# Patient Record
Sex: Female | Born: 1970 | Hispanic: Yes | Marital: Single | Smoking: Never smoker
Health system: Southern US, Community
[De-identification: ages and names within clinical notes are randomized; demographics above are authoritative.]

## PROBLEM LIST (undated history)

## (undated) DIAGNOSIS — Z992 Dependence on renal dialysis: Secondary | ICD-10-CM

## (undated) DIAGNOSIS — G629 Polyneuropathy, unspecified: Secondary | ICD-10-CM

## (undated) DIAGNOSIS — E139 Other specified diabetes mellitus without complications: Secondary | ICD-10-CM

## (undated) DIAGNOSIS — N186 End stage renal disease: Secondary | ICD-10-CM

## (undated) DIAGNOSIS — E785 Hyperlipidemia, unspecified: Secondary | ICD-10-CM

## (undated) DIAGNOSIS — N289 Disorder of kidney and ureter, unspecified: Secondary | ICD-10-CM

## (undated) HISTORY — PX: AV FISTULA PLACEMENT: SHX1204

## (undated) HISTORY — PX: LEG SURGERY: SHX1003

---

## 2009-05-28 ENCOUNTER — Inpatient Hospital Stay: Payer: Self-pay | Admitting: *Deleted

## 2009-08-16 ENCOUNTER — Inpatient Hospital Stay: Payer: Self-pay | Admitting: Internal Medicine

## 2009-08-17 ENCOUNTER — Ambulatory Visit: Payer: Self-pay | Admitting: Cardiovascular Disease

## 2009-09-22 ENCOUNTER — Ambulatory Visit: Payer: Self-pay | Admitting: Vascular Surgery

## 2009-10-04 ENCOUNTER — Ambulatory Visit: Payer: Self-pay | Admitting: Vascular Surgery

## 2009-10-28 ENCOUNTER — Emergency Department: Payer: Self-pay | Admitting: Emergency Medicine

## 2010-08-16 ENCOUNTER — Ambulatory Visit: Payer: Self-pay | Admitting: Ophthalmology

## 2010-08-29 ENCOUNTER — Ambulatory Visit: Payer: Self-pay | Admitting: Ophthalmology

## 2010-11-24 ENCOUNTER — Ambulatory Visit: Payer: Self-pay | Admitting: Ophthalmology

## 2010-12-05 ENCOUNTER — Ambulatory Visit: Payer: Self-pay | Admitting: Ophthalmology

## 2011-09-11 ENCOUNTER — Emergency Department: Payer: Self-pay | Admitting: Emergency Medicine

## 2011-09-11 LAB — COMPREHENSIVE METABOLIC PANEL
Alkaline Phosphatase: 212 U/L — ABNORMAL HIGH (ref 50–136)
BUN: 28 mg/dL — ABNORMAL HIGH (ref 7–18)
Bilirubin,Total: 0.9 mg/dL (ref 0.2–1.0)
Co2: 27 mmol/L (ref 21–32)
Creatinine: 3.5 mg/dL — ABNORMAL HIGH (ref 0.60–1.30)
EGFR (Non-African Amer.): 15 — ABNORMAL LOW
Osmolality: 272 (ref 275–301)
Potassium: 4.1 mmol/L (ref 3.5–5.1)
SGPT (ALT): 28 U/L
Sodium: 130 mmol/L — ABNORMAL LOW (ref 136–145)
Total Protein: 8.8 g/dL — ABNORMAL HIGH (ref 6.4–8.2)

## 2011-09-11 LAB — MAGNESIUM: Magnesium: 1.8 mg/dL

## 2011-09-11 LAB — CBC
HGB: 12.4 g/dL (ref 12.0–16.0)
Platelet: 164 10*3/uL (ref 150–440)
RBC: 4.17 10*6/uL (ref 3.80–5.20)

## 2011-10-24 ENCOUNTER — Ambulatory Visit: Payer: Self-pay | Admitting: Vascular Surgery

## 2012-07-14 ENCOUNTER — Ambulatory Visit: Payer: Self-pay | Admitting: Vascular Surgery

## 2012-07-24 ENCOUNTER — Other Ambulatory Visit: Payer: Self-pay | Admitting: Internal Medicine

## 2012-07-24 LAB — VANCOMYCIN, TROUGH: Vancomycin, Trough: 10 ug/mL (ref 10–20)

## 2012-08-02 ENCOUNTER — Other Ambulatory Visit: Payer: Self-pay | Admitting: Internal Medicine

## 2012-08-02 LAB — VANCOMYCIN, TROUGH: Vancomycin, Trough: 29 ug/mL (ref 10–20)

## 2014-04-09 ENCOUNTER — Inpatient Hospital Stay: Payer: Self-pay | Admitting: Internal Medicine

## 2014-06-12 IMAGING — XA IR VASCULAR PROCEDURE
11 series · 15 of 20 positions shown · IV contrast (IODINE)
Comparison: none

[Series 1: care upper arm · 1 of 2 slices shown (1 of 9)]
[im 1/2]
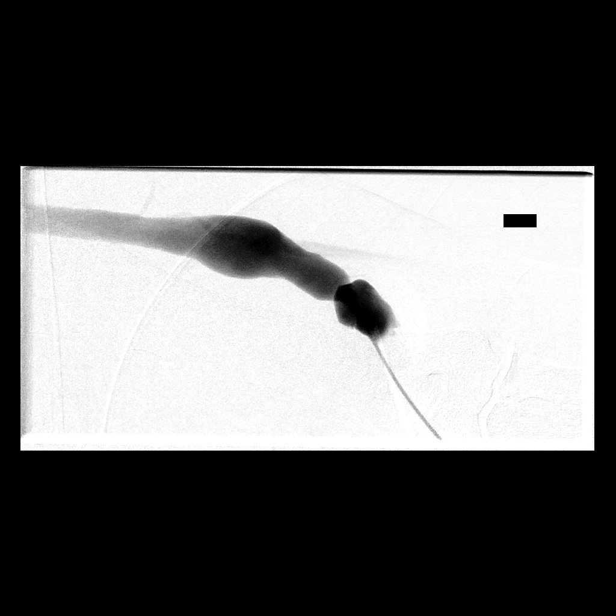

[Series 2: care upper arm · 2 of 2 slices shown (2 of 9)]
[im 1/2]
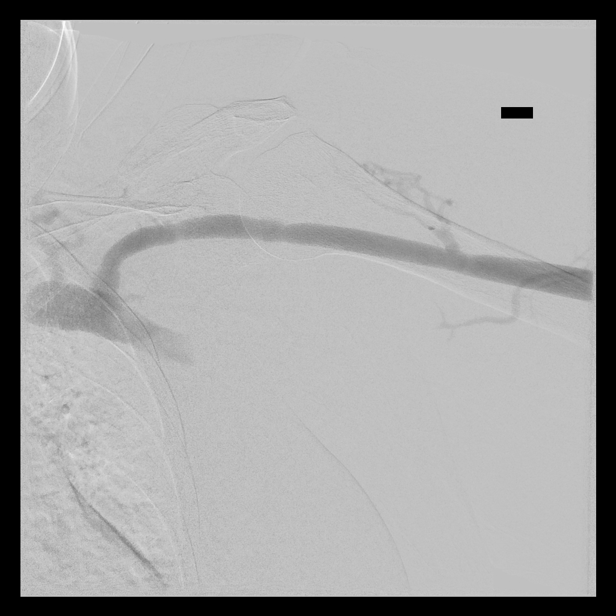
[im 2/2]
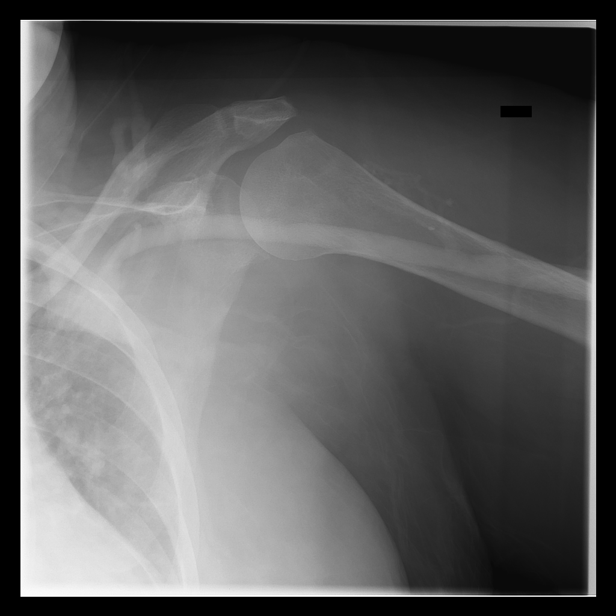

[Series 3: care upper arm · 1 of 2 slices shown (3 of 9)]
[im 1/2]
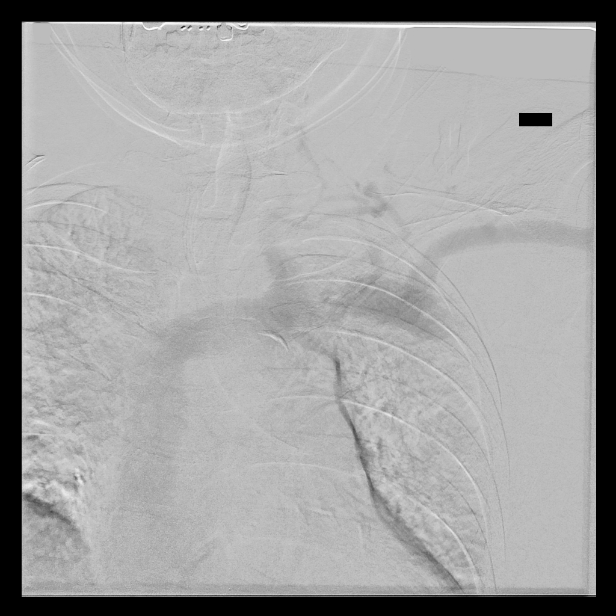

[Series 4: care upper arm · 2 of 2 slices shown (4 of 9)]
[im 1/2]
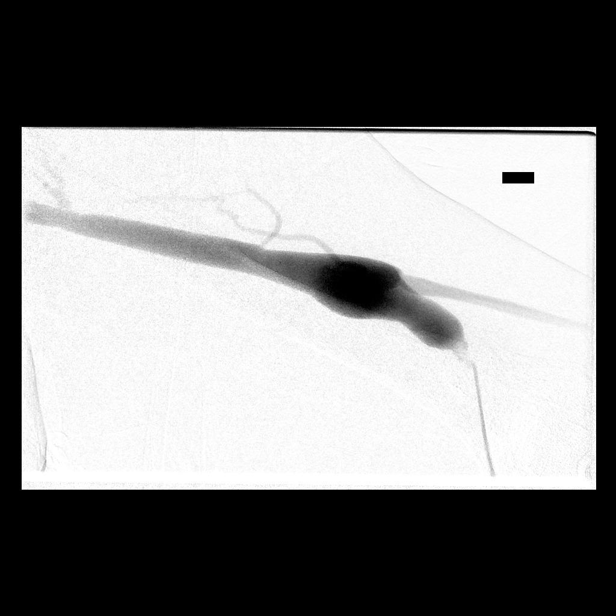
[im 2/2]
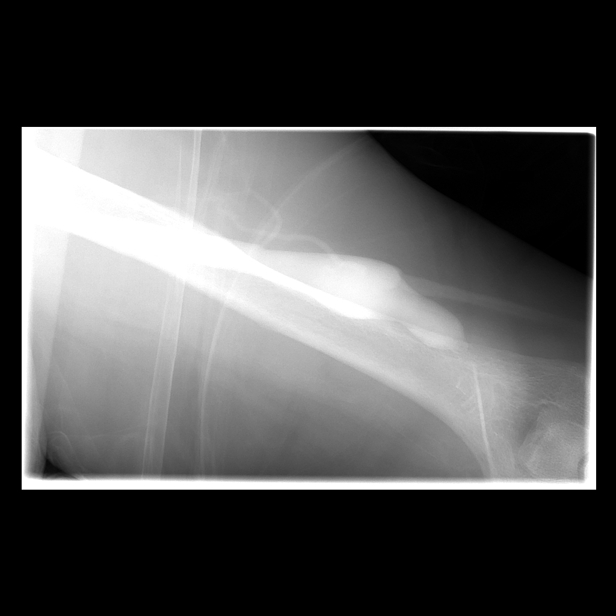

[Series 5: care upper arm · 1 of 2 slices shown (5 of 9)]
[im 1/2]
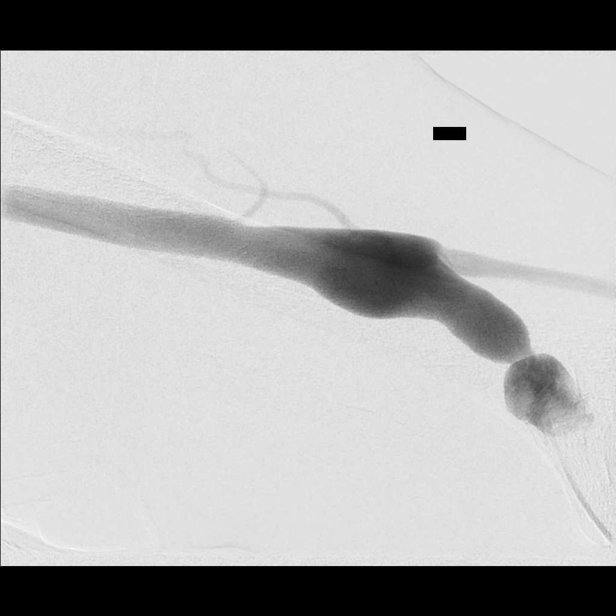

[Series 6: care upper arm · 2 of 2 slices shown (6 of 9)]
[im 1/2]
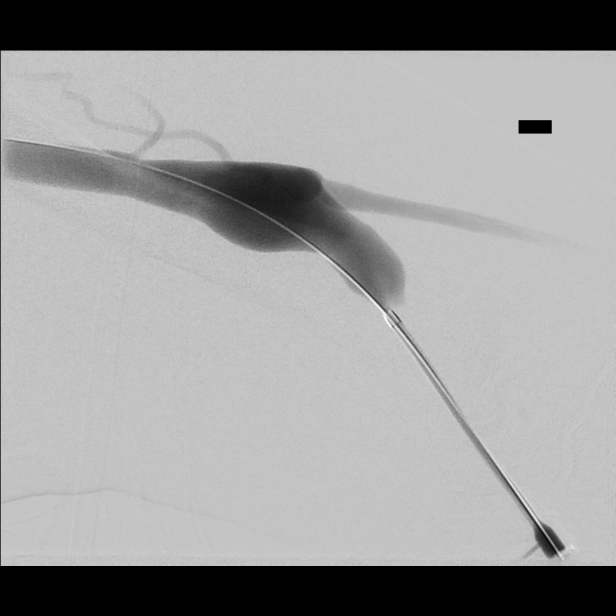
[im 2/2]
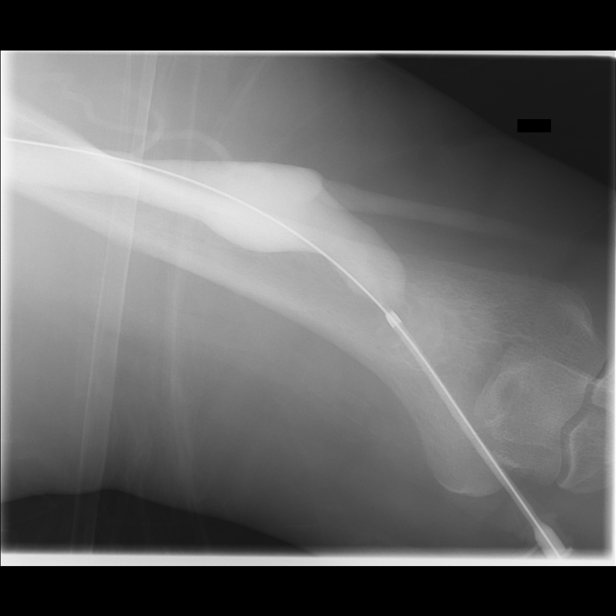

[Series 7: fl - angio · 1 of 1 slices shown (1 of 2)]
[im 1/1]
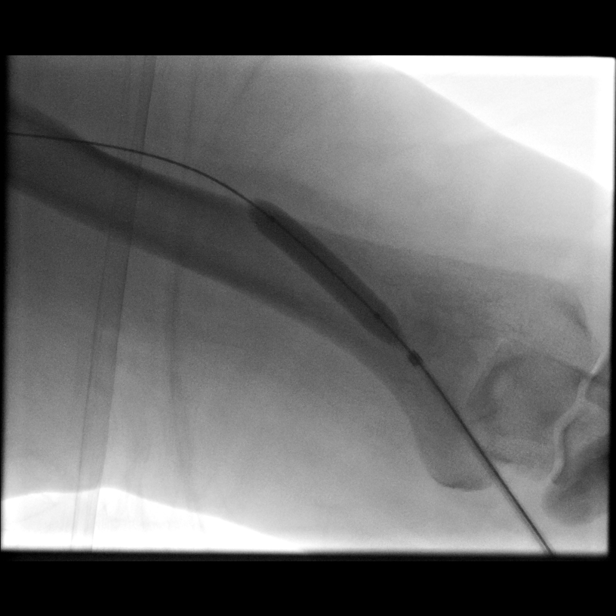

[Series 8: care upper arm · 1 of 2 slices shown (7 of 9)]
[im 2/2]
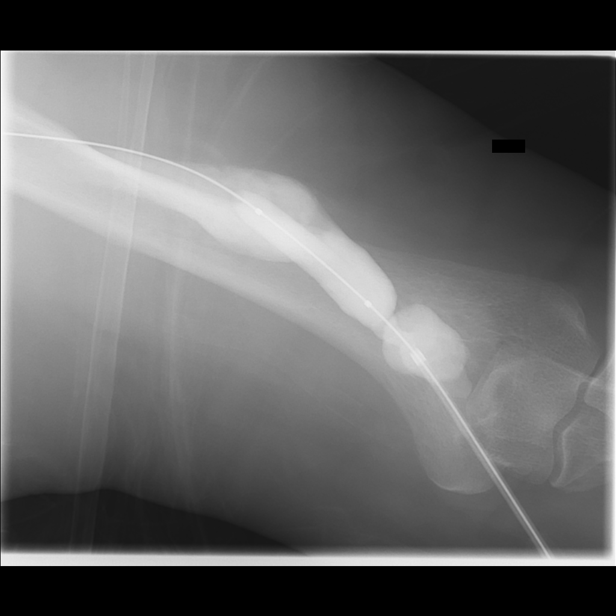

[Series 9: fl - angio · 1 of 1 slices shown (2 of 2)]
[im 1/1]
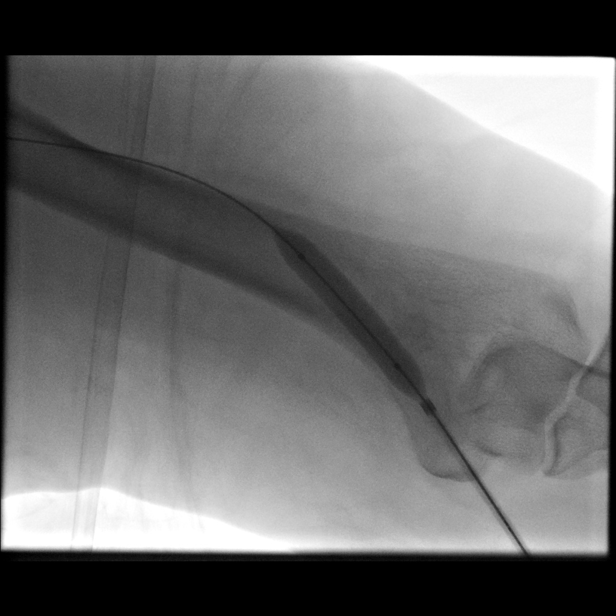

[Series 10: care upper arm · 1 of 2 slices shown (8 of 9)]
[im 1/2]
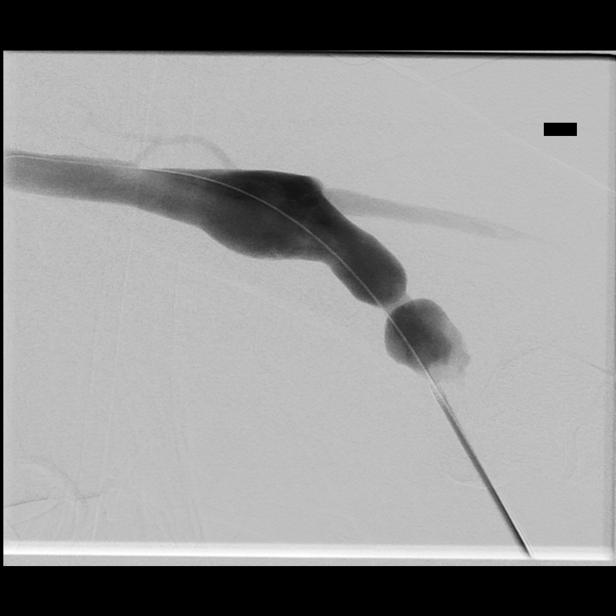

[Series 11: care upper arm · 2 of 2 slices shown (9 of 9)]
[im 1/2]
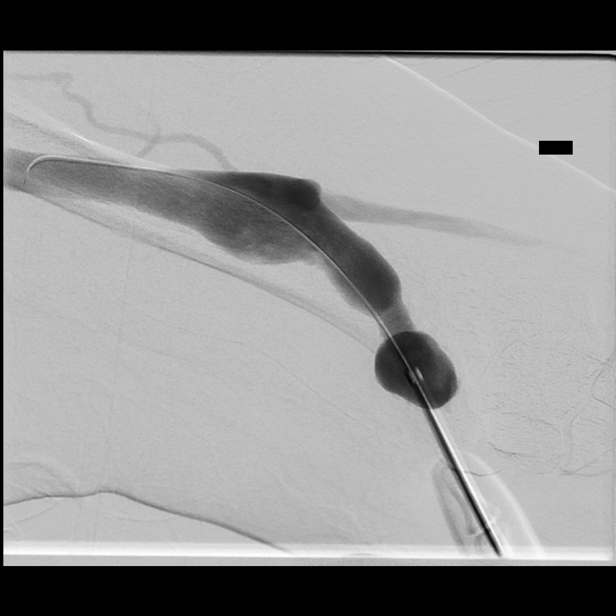
[im 2/2]
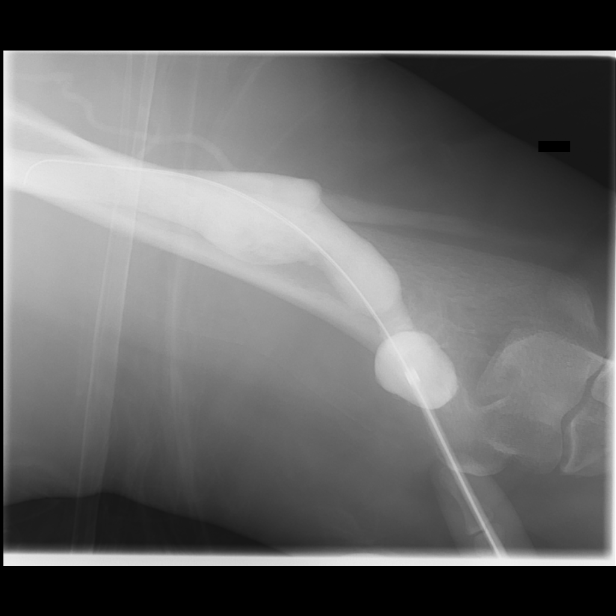

[15 of 20 positions shown; findings below may reference images not displayed]

IMAGES IMPORTED FROM THE [employer] WORKFLOW SYSTEM
NO DICTATION FOR STUDY

## 2014-06-15 NOTE — Op Note (Signed)
PATIENT NAME:  [name], [name] MR#:  [id number] DATE OF BIRTH:  Jun 08, 1970  DATE OF PROCEDURE:  10/24/2011  PREOPERATIVE DIAGNOSES:  1. End-stage renal disease.  2. Poorly functioning left arm AV fistula with recent decrease in transonic flows. 3. Hypertension.   POSTOPERATIVE DIAGNOSES:  1. End-stage renal disease.  2. Poorly functioning left arm AV fistula with recent decrease in transonic flows. 3. Hypertension.   PROCEDURES:  1. Ultrasound guidance for vascular access, left brachiocephalic AV fistula.  2. Left upper extremity fistulogram.  3. Percutaneous transluminal angioplasty with 8 and 10 mm diameter angioplasty balloons for a cephalic vein stenosis in the proximal portion of the fistula between aneurysmal segments.   SURGEON: Annice NeedyJason S. Yides Saidi, MD   ANESTHESIA: Local with moderate conscious sedation.   ESTIMATED BLOOD LOSS: Approximately 50 mL.  FLUOROSCOPY TIME: Approximately 6 minutes.   CONTRAST USED: 40 mL.   INDICATION FOR PROCEDURE: This is a 44 year old Hispanic female with renal failure. She has had a recent decrease in her transonic flows and we are asked to evaluate this with fistulogram. Risks and benefits are discussed. Informed consent was obtained.   DESCRIPTION OF PROCEDURE: The patient is brought to the Vascular Interventional Radiology Suite. Left upper extremity was sterilely prepped and draped and a sterile surgical field was created. The fistula was accessed not far beyond the anastomosis with a micropuncture needle, micropuncture wire and then sheath were placed. The imaging showed a high-grade stenosis in the 80 to 90% range between aneurysmal segments in the proximal portion of the fistula. The remainder of the fistula was patent without significant flow limiting stenosis. The patient was given 2500 units of intravenous heparin for systemic anticoagulation. A 6 French sheath was placed over a Glidewire. Glidewire was used to cross the lesion which was done  with a moderate amount of difficulty. I then performed percutaneous transluminal angioplasty for this stenosis with an 8 mm diameter angioplasty balloon with suboptimal result and then a 10 mm diameter angioplasty balloon was used which improved the flow and decreased the area of stenosis to the 20 to 30% range. At this point I elected to terminate the procedure. The sheath was removed around a 4-0 Monocryl purse-string suture. Pressure was held. Sterile dressing was placed. The patient tolerated the procedure well and was taken to the recovery room in stable condition.   ____________________________ Annice NeedyJason S. Henslee Lottman, MD jsd:drc D: 10/24/2011 15:09:01 ET T: 10/24/2011 15:51:07 ET JOB#: [id number]  cc: Annice NeedyJason S. Martice Doty, MD, <Dictator> Molli Barrowsaven Voora, MD Annice NeedyJASON S Ralynn San MD ELECTRONICALLY SIGNED 10/31/2011 10:01

## 2014-06-18 NOTE — Op Note (Signed)
PATIENT NAME:  [name], [name] MR#:  [id number] DATE OF BIRTH:  Jul 07, 1970  DATE OF PROCEDURE:  07/14/2012  PREOPERATIVE DIAGNOSES: 1.  End-stage renal disease.  2.  Poorly functioning left arm arteriovenous fistula.  3.  Hypertension.  4.  Diabetes.   POSTOPERATIVE DIAGNOSES: 1.  End-stage renal disease.  2.  Poorly functioning left arm arteriovenous fistula.  3.  Hypertension.  4.  Diabetes.   PROCEDURES: 1.  Ultrasound guidance for vascular access to left arm AV fistula.  2.  Left upper extremity fistulogram and central venogram.  3.  Percutaneous transluminal angioplasty of perianastomotic stenosis with 6 mm diameter angioplasty balloon.  4.  Percutaneous transluminal angioplasty of cephalic vein stenosis near pseudoaneurysm with 9 mm diameter angioplasty balloon.   SURGEON: Annice NeedyJason S. Arland Usery, M.D.   ANESTHESIA: Local with moderate conscious sedation.   ESTIMATED BLOOD LOSS: Minimal.   CONTRAST USED: 35 mL Visipaque.   FLUOROSCOPY TIME: Approximately 2 minutes.   INDICATION FOR PROCEDURE: A 44 year old Hispanic female with end-stage renal disease. Noninvasive study showed stenosis in her AV fistula with diminished function on dialysis. She is brought in for further evaluation with fistulogram for salvage of the AV fistula. Risks and benefits were discussed. Informed consent was obtained.   DESCRIPTION OF PROCEDURE: The patient is brought to the vascular and interventional radiology suite. Left upper extremity was sterilely prepped and draped, and a sterile surgical field was created. The fistula was accessed in the mid upper arm initially in retrograde fashion with ultrasound guidance, and a micropuncture needle and micropuncture wire and sheath were then placed. We upsized to a 6-French sheath. A Kumpe catheter was used to gain access at the level of the anastomosis. This showed an intimal hyperplasia stenosis that was in the moderate to high-grade range at just beyond the  anastomosis. About 5 to 7 cm further downstream, there was a pseudoaneurysm at the arterial access site associated with what appeared to be a moderate stenosis. This was a little bit difficult to evaluate due to a moderate-sized pseudoaneurysm, but this also correlated with an area of increased velocities on her ultrasound. The remainder of the fistula was patent. There was some mild narrowing near the cephalic vein-subclavian vein confluence, but this did not appear flow-limiting. Central venous circulation was patent. I crossed these lesions with a Magic Torque wire. I treated the perianastomotic stenosis with a 6 mm diameter angioplasty balloon and the cephalic vein stenosis near the pseudoaneurysm with a 9 mm diameter angioplasty balloon. Tight waists were taken in both locations, which improved with angioplasty. Following angioplasty, angiogram showed this area  now to have  significant improvement. There was probably a 30% residual stenosis in the perianastomotic stenosis and approximately a 20% residual stenosis near the pseudoaneurysm. Neither were flow limiting. At this point, I elected to terminate the procedure. The sheath was removed around a 4-0 Monocryl pursestring suture. Pressure was held. Sterile dressing was placed.  ____________________________ Annice NeedyJason S. Laszlo Ellerby, MD jsd:jm D: 07/14/2012 11:50:39 ET T: 07/14/2012 13:27:06 ET JOB#: [id number]  cc: Annice NeedyJason S. Yandel Zeiner, MD, <Dictator> Annice NeedyJASON S Genora Arp MD ELECTRONICALLY SIGNED 07/23/2012 13:50

## 2014-06-21 LAB — SURGICAL PATHOLOGY

## 2014-06-27 NOTE — H&P (Signed)
PATIENT NAME:  [name], [name] MR#:  [id number] DATE OF BIRTH:  1970/03/29  DATE OF ADMISSION:  04/09/2014  REFERRING PHYSICIAN: forbach  [facility]   CHIEF COMPLAINT: Left foot pain.   HISTORY OF PRESENT ILLNESS: A 44 year old Hispanic female, Spanish-speaking, with history of type 2 diabetes, insulin-requiring, complicated by neuropathy, as well as end-stage renal disease on hemodialysis, presenting with left foot pain. She describes a 2 day duration of left foot pain, described only as "pain." Intensity 7 to 8 out of 10, worse with movement as well as standing. No relieving factors.  associated edema erythema, subjective fevers and chills, thus presented to the hospital for further work-up and evaluation given worsening symptoms. Of note, last hemodialysis performed yesterday, and she is due once again tomorrow.   REVIEW OF SYSTEMS:  CONSTITUTIONAL: Positive for fevers, chills, fatigue, weakness.  EYES: Denies blurred vision, double vision, double vision, or eye pain.  EARS, NOSE, THROAT: Denies tinnitus, ear pain, hearing loss.  RESPIRATORY: Denies cough, wheeze, shortness of breath.  CARDIOVASCULAR: Denies chest pain, palpitations, edema.  GASTROINTESTINAL: Denies nausea, vomiting, diarrhea, or abdominal pain.  GENITOURINARY: Denies dysuria, hematuria.  ENDOCRINE: Denies nocturia or thyroid problems.  HEMATOLOGIC AND LYMPHATIC: Denies easy bruising or bleeding.  SKIN: Positive for erythematous lesion of the left foot.  MUSCULOSKELETAL: Denies pain in neck, back, shoulder, knees, hips or further arthritic symptoms.  NEUROLOGIC: Denies paralysis, paresthesias.  PSYCHIATRIC: Denies anxiety or depressive symptoms.   Otherwise, full review of systems performed by me is negative.   PAST MEDICAL HISTORY: End-stage renal disease on hemodialysis, hyperlipidemia, unspecified; hypertension essential, type 2 diabetes insulin-requiring, complicated by  peripheral neuropathy.   SOCIAL HISTORY: No alcohol, tobacco, or drug usage.   FAMILY HISTORY: No known cardiovascular or pulmonary disorders.   ALLERGIES: NO KNOWN DRUG ALLERGIES.   HOME MEDICATIONS: Include aspirin 81 mg p.o. daily, lisinopril 40 mg 2 tabs p.o. daily, Lantus 10 units subcutaneous at bedtime, NovoLog 5 units b.i.d., sertraline 10 mg p.o. daily, metoprolol 100 mg p.o. b.i.d., Norvasc 10 mg p.o. daily, Sensipar 60 mg p.o. daily, Fosrenol 1000 mg 2 tablets p.o. 3 times daily.   PHYSICAL EXAMINATION:  VITAL SIGNS: Temperature 99.2, heart rate 105, respirations 18,  188/65, saturating 98% on room air. Weight 180 kg, BMI 66.  GENERAL: Obese Hispanic female currently in minimal distress given pain.  HEAD: Normocephalic, atraumatic.  EYES: Pupils equal, round, and reactive to light. Extraocular muscles intact. No scleral icterus.  MOUTH: Moist mucosal membrane. Dentition intact. No abscess noted.  EARS, NOSE AND THROAT: Clear without exudates. No external lesions.  NECK: Supple. No thyromegaly. No nodules. No JVD.  PULMONARY: Clear to auscultation bilaterally without wheezes, rales, rhonchi. No use of accessory muscles. Good respiratory effort.  CHEST: Nontender to palpation.  CARDIOVASCULAR: S1, S2, regular rate and rhythm. No murmurs, rubs, or gallops. No edema. Pedal pulses 2+ bilaterally.  GASTROINTESTINAL: Soft, nontender, nondistended. No masses. Positive bowel sounds. No hepatosplenomegaly.  MUSCULOSKELETAL: Gross swelling and deformation of left foot, large area of soft tissue swelling of the left arch with associated edema around the medial malleolus which is fluctuant. Range of motion full in all extremities.  NEUROLOGIC: Cranial nerves II through XII intact. No gross focal neurological deficits. Sensation intact. Reflexes intact.  SKIN: Erythematous area of left foot, essentially from ankle downward with large soft tissue swelling as stated above, associated erythema,  which is also warm to touch from the ankle downward. Otherwise, no further lesions, rashes,  or cyanosis. Skin warm, dry. Turgor intact.  PSYCHIATRIC: Mood and affect within normal limits. The patient is awake, alert x3. Insight and judgment intact.   LABORATORY DATA: Sodium 137, potassium 4.3, chloride 97, bicarbonate 25, BUN 56, creatinine 6.7, glucose 176. LFTs: Bilirubin 1.3, alkaline phosphatase 136, AST 14, otherwise within normal limits. WBC of 13.9, hemoglobin 11.9, platelets of 126,000. X-ray of the left foot performed reveals significant soft tissue swelling without soft tissue gas or radiopaque foreign body.   ASSESSMENT AND PLAN: A 44 year old Hispanic female with history of type 2 diabetes, insulin-requiring, complicated by neuropathy as end stage renal disease, on hemodialysis, presented with left foot pain.   1. Sepsis: Meeting septic criteria given, heart rate, as well as leukocytosis secondary to skin infection.  Antibiotic coverage  with vancomycin and Zosyn given history of diabetes. Blood cultures and  formal consult podiatry for the findings of left foot as described above.  2. End-stage renal disease on hemodialysis. Consult nephrology for continuation of dialysis.  3. Hypertension, essential. Continue with her home medications. Also add p.r.n. hydralazine for blood pressure greater than180/100 4. Type 2 diabetes, insulin-requiring, complicated by neuropathy   Accu-Cheks, insulin sliding scale, as well as continuation of home dosage of Lantus.  5. Venous thromboembolism prophylaxis with sequential compression devices.  6. The patient is full code.   TIME SPENT: 45 minutes.   ____________________________ Cletis Athens. Hower, MD dkh:ap D: 04/09/2014 21:40:05 ET T: 04/09/2014 21:59:27 ET JOB#: [id number]  cc: Cletis Athens. Hower, MD, <Dictator> DAVID Synetta Shadow MD ELECTRONICALLY SIGNED 04/10/2014 2:14

## 2014-06-27 NOTE — Op Note (Signed)
PATIENT NAME:  [name], [name] MR#:  [id number] DATE OF BIRTH:  [birth year]  DATE OF PROCEDURE:  04/12/2014   PREOPERATIVE DIAGNOSIS: Soft tissue mass, left foot with abscess.   POSTOPERATIVE DIAGNOSIS:  Multi-lobulated large soft tissue mass, plantar left foot.   PROCEDURE: Excision of soft tissue mass, plantar left foot.   SURGEON: Livio Ledwith A. Ether Curtis, DPM.   ANESTHESIA: LMA with local.   HEMOSTASIS: Mid-calf tourniquet inflated to 300 mmHg for 30 minutes.   COMPLICATIONS: None.   SPECIMEN: Large soft tissue mass, multi-lobulated with gross appearance of sebaceous cyst type of lesion, left foot and culture was taken of the lesion.   ESTIMATED BLOOD LOSS: 200 mL.   COMPLICATIONS: None.   OPERATIVE INDICATIONS: This is a 44 year old female seen upon admission with an elevated white blood cell count with erythema and a draining superficial ulcerative site to the plantar aspect of her left arch.  MRI was performed, which showed a multi-loculated large soft tissue mass with possible abscess.  In the hospital, she was found to have a thick purulent-type of drainage from the plantar aspect of her left foot consistent with an abscess.  She also had a thick caseous-type of fluid, and material that was sent for pathological examination. This did reveal findings similar to the sebaceous or epidermal cystic lesion. With the findings of what looked to be a large fluid-filled mass with possible infection, we discussed surgical I and D of this with possible excision of the mass and she consented to this.   DESCRIPTION OF PROCEDURE: The patient was brought into the OR and placed on operating table in the supine position. General intubation was administered by the anesthesia team. The left lower extremity was then prepped and draped in the usual sterile fashion. Attention was directed to the plantar aspect of the left foot, where after inflation of the tourniquet, a large curvilinear incision was made  proximal to the MTPJ to the heel region overlying the central portion of the mass.  There was noted to be a large amount of venous bleeding where eventually, I had to drop the tourniquet and the bleeding ceased.  I did pack the area with Surgicel, both during the procedure as well as at closure.  After all bleeding was stopped, I was able to further dissect into the lesion. There was noted a multi-lobulated mass extending from the skin to the fascial layers with area surrounding the fascia. I excised a large bulk of the lesion. There were small areas of thick, semi-purulent type of material consistent with sebaceous cyst type of appearance.  No further areas of purulent drainage were noted after excision of the bulk of the soft tissue mass. The wound was flushed with copious amounts of irrigation.  At this time, a layered closure was performed with a 3-0 Vicryl for the deeper layer and a 2-0 nylon for skin.  A well compressed sterile bulky dressing was then placed around the left foot and ankle.  She was taken from the Operating Room to the PACU with all vital signs stable and neurovascular status intact.  She will be readmitted to the floor.  We will monitor this skin flaps for any dysvascular appearance.  Hopefully, I can discharge in the next couple of days, if the patient is stable. We will await final pathology results as well.     ____________________________ Argentina Donovan. Ether Curtis, DPM jaf:DT D: 04/12/2014 17:04:10 ET T: 04/12/2014 19:19:19 ET JOB#: [id number]  cc: [name], DPM, <Dictator> [name]  [name] DPM ELECTRONICALLY SIGNED 04/16/2014 9:48

## 2014-06-27 NOTE — Consult Note (Signed)
Admit Diagnosis:   SEPSIS DUE TO UNSPECIFIED ORGANISM.: Onset Date: 10-Apr-2014, Status: Active, Description: SEPSIS DUE TO UNSPECIFIED ORGANISM.    hemodialysis:    High cholesterol:    HTN:    ESRD:    DM:    fistula left FA:    right chest dialysis port:   Home Medications: Medication Instructions Status  Sensipar 60 mg oral tablet 1 tab(s) orally once a day Active  lisinopril 40 mg oral tablet 2 tab(s) orally once a day Active  metoprolol tartrate 100 mg oral tablet 1 tab(s) orally 2 times a day Active  cetirizine 10 mg oral tablet 1 tab(s) orally once a day Active  amLODIPine 10 mg oral tablet 1 tab(s) orally once a day Active  Fosrenol 1000 mg oral tablet, chewable 2 tab(s) orally 3 times a day (with meals) Active  aspirin 81 mg oral tablet 1 tab(s) orally once a day Active  Lantus 100 units/mL subcutaneous solution 10 unit(s) subcutaneous once a day (at bedtime) Active  NovoLog 100 units/mL subcutaneous solution 5 unit(s) subcutaneous 2 times a day (with meals) Active   Lab Results:  Routine Micro:  12-Feb-16 19:52   Micro Text Report BLOOD CULTURE   COMMENT                   NO GROWTH IN 8-12 HOURS   ANTIBIOTIC                         19:53   Micro Text Report BLOOD CULTURE   COMMENT                   NO GROWTH IN 8-12 HOURS   ANTIBIOTIC                       Routine Hem:  12-Feb-16 19:53   Erythrocyte Sed Rate  35 (Result(s) reported on 09 Apr 2014 at 10:26PM.)  13-Feb-16 13:25   WBC (CBC)  16.1  Hemoglobin (CBC)  10.8  Hematocrit (CBC)  33.3   Radiology Results:  Radiology Results: XRay:    12-Feb-16 19:57, Foot Left Complete  Foot Left Complete  REASON FOR EXAM:    pain/wound, r/o osteomyelitis  COMMENTS:       PROCEDURE: DXR - DXR FOOT LT COMP W/OBLIQUES  - Apr 09 2014  7:57PM     CLINICAL DATA:  Pt reports redness to left foot, fever and anterior  chest pressure starting yesterday. Pt has a chronic, non-healing  wound to L foot. Pt  was seen last week at [facility] for increasing pain.  Pt was not rx'd abx at this time. h/o htn, dm, dialysis    EXAM:  LEFT FOOT - COMPLETE 3+ VIEW    COMPARISON:  None.  FINDINGS:  There is significant soft tissue edema of the foot. There is and  lateral subluxation of the fourth and fifth tarsometatarsal joint.  There is complete dislocation of the second and third  tarsometatarsal joints. There is medial subluxation of the first  tarsometatarsal joint. Changes are consistent with divergent  Lisfranc type dislocation. There is fragmentation of the first  cuneiform. No acute fractures or significant osseous erosions.     IMPRESSION:  1. Significant soft tissue swelling without soft tissue gas or  radiopaque foreign body.  2. Divergent Lisfranc dislocation of the midfoot.    Electronically Signed    By: Norva Pavlov M.D.    On: 04/09/2014  20:58         Verified By: Patterson HammersmithELIZABETH D. BROWN, M.D.,    No Known Allergies:   Nursing Flowsheets: **Vital Signs.:   13-Feb-16 07:50  Temperature Temperature (F) 99.3  Oxygen Delivery Room Air/ 21 %    General Aspect Pt seen with interpreter.  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  State ~3days ago developed worsening focal swelling to plantar left midfoot with redness and pain.  Some chills associated and admitted from ED.   Case History and Physical Exam:  Cardiovascular Palpable pulses to left foot.   Musculoskeletal Noted focal edema to left foot with rocker bottom shape.  Marked focal prominence to midfoot with mild fluctuence.  No severe crepitence with midfoot ROM.  Some pain with palpation to midfoot.   Neurological Gross sensation intact with palpation.  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   Skin Erythema to diffuse plantar midfoot.  Small focal hyperkeratotic lesion of midfoot.  Supicious for abscess    Impression Pt with marked midfoot deformity 2ndary to charcot foot. Concern for  abscess vs worsening acute deformity. Will attempt beside debridment and culture if infection seen.   Likely MRI to further assess midfoot for infection vs acute charcot.   Electronic Signatures for Addendum Section:  Gwyneth RevelsFowler, Arria Naim (MD) (Signed Addendum 13-Feb-16 16:05)  Bedside Debridment Excisional type performed. Prepped with Betadine and superficial debridemnt performed to left foot.  Depth to dermis.  At this time a thick caseous material was expressed.  No frank purulence.  This was sent for wound culture and pathology. Debridment was 1 cm in diameter.  Large bulky dressing applied.   Electronic Signatures: Gwyneth RevelsFowler, Ercel Normoyle (MD)  (Signed 13-Feb-16 15:30)  Authored: Health Issues, Significant Events - History, Home Medications, Labs, Radiology Results, Allergies, Vital Signs, General Aspect/Present Illness, History and Physical Exam, Impression/Plan   Last Updated: 13-Feb-16 16:05 by Gwyneth RevelsFowler, Millee Denise (MD)

## 2014-06-27 NOTE — Discharge Summary (Signed)
PATIENT NAME:  [name], [name] MR#:  [id number] DATE OF BIRTH:  1970/06/05  DATE OF ADMISSION:  04/09/2014 DATE OF DISCHARGE:  04/15/2014  ADMITTING DIAGNOSIS: Left foot pain.  DISCHARGE DIAGNOSES: 1.  Left foot pain due to large complex epidermoid cyst status post resection. Possible concurrent infection with this. 2.  End-stage renal disease, on dialysis.  3.  Hypertension, essential.  4.  Hyperlipidemia, unspecified. 5.  Peripheral neuropathy.   CONSULTANTS: Dr. Vickki Muff, Dr. Anthonette Legato.  PERTINENT LABS AND EVALUATIONS: Admitting glucose 176, BUN 56, creatinine 6.70, sodium 137, potassium 4.3, chloride 97, CO2 25, calcium 9.2. LFTs showed alk phos of 136. WBC 13.9, hemoglobin 11.9, platelet count 129,000.   Blood cultures: No growth. Wound cultures: No growth.   MRI of the foot showed a complex subcutaneous mass in the plantar mid foot not typical for abscess.   HOSPITAL COURSE: Please refer to H and P done by the admitting physician. The patient is a 44 year old Spanish female who is on dialysis. Presented with swelling on the bottom of her foot. It appeared to be a possible abscess. She did have some fevers on presentation. She was initially thought to have an abscess with sepsis. She was started on broad-spectrum antibiotics. A podiatry consult was obtained. She was seen by Dr. Vickki Muff. The patient went to the OR on February 15th with excision of the soft tissue mass, plantar left foot. Pathology of this returned as an epidermoid cyst. There was no growth. The patient was recommended to bear no weight until the wound healed. At this time, she is stable for discharge with outpatient follow-up with Dr. Vickki Muff.   DISCHARGE MEDICATIONS: Sensipar 60 daily, lisinopril 40 two tabs daily, metoprolol tartrate 100 one tab p.o. b.i.d., cetirizine 10 daily, amlodipine 10 daily, Fosrenol 2000 mg t.i.d. with meals, aspirin 81 one tab p.o. daily, Lantus 10 units at bedtime, NovoLog 5 units b.i.d.  with meals, acetaminophen/oxycodone 325/5 q. 6 p.r.n. for pain, amoxicillin/clavulanic acid 500 one tab p.o. q. 24 x5 days.   DISCHARGE DIET: Low sodium, low fat, low cholesterol, carbohydrate, renal diet.   DISCHARGE ACTIVITY: Left leg: No weight bearing. Use walker to ambulate.  DISCHARGE INSTRUCTIONS: Follow up with Dr. Vickki Muff in 1 week.  TIME SPENT: 35 minutes.  ____________________________ Lafonda Mosses Posey Pronto, MD shp:sb D: 04/16/2014 08:17:25 ET T: 04/16/2014 10:41:09 ET JOB#: [id number]  cc: Malahki Gasaway H. Posey Pronto, MD, <Dictator> Alric Seton MD ELECTRONICALLY SIGNED 04/16/2014 15:58

## 2014-06-27 NOTE — Op Note (Signed)
PATIENT NAME:  [name], [name] MR#:  [id number] DATE OF BIRTH:  [birth year]  DATE OF PROCEDURE:  04/14/2014  REASON FOR PROCEDURE:  Wound dehiscence, left foot.   INDICATIONS:  I was informed that the patient had been bearing weight on her left foot with drainage and dehiscence of the incision on the plantar aspect of her left foot. She was seen today and had a noted area of approximately 4 cm in length x 1 cm in width dehisced area in the central aspect of her incision from the previous surgery. We discussed primary closure of this at bedside. The patient is completely neuropathic, and no anesthesia was needed. The patient did consent to this verbally. The patient was seen with the assistance of an interpreter.   PROCEDURE NOTE:  Timeout was performed at bedside, and the proper area was exposed and marked and draped appropriately. The wound was then prepped with Betadine. Next, a 3-0 nylon was used for closure of the central portion of the dehisced area. An approximately 4 cm wound dehiscence had occurred. Good closure was performed. There was no purulent drainage or signs of infection to the area. A bulky sterile dressing was placed on her left foot. Strict nonweightbearing was once again discussed with the patient with the assistance of the interpreter. I will see her in the outpatient clinic in 5 to 7 days.    ____________________________ Argentina DonovanJustin A. Ether GriffinsFowler, DPM jaf:nb D: 04/14/2014 20:17:15 ET T: 04/15/2014 06:34:07 ET JOB#: [id number]  cc: Jill AlexandersJustin A. Ether GriffinsFowler, DPM, <Dictator> Waverly Tarquinio DPM ELECTRONICALLY SIGNED 04/16/2014 9:48

## 2015-07-11 DIAGNOSIS — Z833 Family history of diabetes mellitus: Secondary | ICD-10-CM

## 2015-07-11 DIAGNOSIS — Z992 Dependence on renal dialysis: Secondary | ICD-10-CM

## 2015-07-11 DIAGNOSIS — E871 Hypo-osmolality and hyponatremia: Secondary | ICD-10-CM | POA: Diagnosis present

## 2015-07-11 DIAGNOSIS — E1122 Type 2 diabetes mellitus with diabetic chronic kidney disease: Secondary | ICD-10-CM | POA: Diagnosis present

## 2015-07-11 DIAGNOSIS — K5900 Constipation, unspecified: Secondary | ICD-10-CM | POA: Diagnosis present

## 2015-07-11 DIAGNOSIS — R197 Diarrhea, unspecified: Secondary | ICD-10-CM | POA: Diagnosis present

## 2015-07-11 DIAGNOSIS — E785 Hyperlipidemia, unspecified: Secondary | ICD-10-CM | POA: Diagnosis present

## 2015-07-11 DIAGNOSIS — A048 Other specified bacterial intestinal infections: Secondary | ICD-10-CM | POA: Diagnosis present

## 2015-07-11 DIAGNOSIS — I447 Left bundle-branch block, unspecified: Secondary | ICD-10-CM | POA: Diagnosis present

## 2015-07-11 DIAGNOSIS — E211 Secondary hyperparathyroidism, not elsewhere classified: Secondary | ICD-10-CM | POA: Diagnosis present

## 2015-07-11 DIAGNOSIS — Z22322 Carrier or suspected carrier of Methicillin resistant Staphylococcus aureus: Secondary | ICD-10-CM

## 2015-07-11 DIAGNOSIS — E875 Hyperkalemia: Principal | ICD-10-CM | POA: Diagnosis present

## 2015-07-11 DIAGNOSIS — N186 End stage renal disease: Secondary | ICD-10-CM | POA: Diagnosis present

## 2015-07-11 DIAGNOSIS — K6289 Other specified diseases of anus and rectum: Secondary | ICD-10-CM | POA: Diagnosis present

## 2015-07-11 DIAGNOSIS — E1140 Type 2 diabetes mellitus with diabetic neuropathy, unspecified: Secondary | ICD-10-CM | POA: Diagnosis present

## 2015-07-11 LAB — CBC
HEMATOCRIT: 41.6 % (ref 35.0–47.0)
HEMOGLOBIN: 13.8 g/dL (ref 12.0–16.0)
MCH: 29.2 pg (ref 26.0–34.0)
MCHC: 33.1 g/dL (ref 32.0–36.0)
MCV: 88.3 fL (ref 80.0–100.0)
Platelets: 124 10*3/uL — ABNORMAL LOW (ref 150–440)
RBC: 4.71 MIL/uL (ref 3.80–5.20)
RDW: 15.1 % — AB (ref 11.5–14.5)
WBC: 8.6 10*3/uL (ref 3.6–11.0)

## 2015-07-11 NOTE — ED Notes (Addendum)
Pt in with co diarrhea for 2 weeks now co rectal pain states may have a hemorrhoid, no abd pain.  Pt states today had numbness in feet bilat, states has happened in the past.  Pt is on dialysis had treatment Saturday.

## 2015-07-12 ENCOUNTER — Inpatient Hospital Stay
Admission: EM | Admit: 2015-07-12 | Discharge: 2015-07-13 | DRG: 640 | Disposition: A | Discharge status: Home / Self Care | Payer: Self-pay | Attending: Internal Medicine | Admitting: Internal Medicine

## 2015-07-12 ENCOUNTER — Encounter: Payer: Self-pay | Admitting: Internal Medicine

## 2015-07-12 DIAGNOSIS — R197 Diarrhea, unspecified: Secondary | ICD-10-CM

## 2015-07-12 DIAGNOSIS — E875 Hyperkalemia: Secondary | ICD-10-CM

## 2015-07-12 HISTORY — DX: End stage renal disease: N18.6

## 2015-07-12 HISTORY — DX: Dependence on renal dialysis: Z99.2

## 2015-07-12 HISTORY — DX: Polyneuropathy, unspecified: G62.9

## 2015-07-12 HISTORY — DX: Hyperlipidemia, unspecified: E78.5

## 2015-07-12 HISTORY — DX: Other specified diabetes mellitus without complications: E13.9

## 2015-07-12 LAB — COMPREHENSIVE METABOLIC PANEL
ALBUMIN: 4.5 g/dL (ref 3.5–5.0)
ALT: 15 U/L (ref 14–54)
ALT: 13 U/L — ABNORMAL LOW (ref 14–54)
ANION GAP: 20 — AB (ref 5–15)
AST: 14 U/L — ABNORMAL LOW (ref 15–41)
AST: 20 U/L (ref 15–41)
Albumin: 3.9 g/dL (ref 3.5–5.0)
Alkaline Phosphatase: 149 U/L — ABNORMAL HIGH (ref 38–126)
Alkaline Phosphatase: 113 U/L (ref 38–126)
Anion gap: 15 (ref 5–15)
BILIRUBIN TOTAL: 0.9 mg/dL (ref 0.3–1.2)
BUN: 92 mg/dL — ABNORMAL HIGH (ref 6–20)
BUN: 33 mg/dL — ABNORMAL HIGH (ref 6–20)
CHLORIDE: 88 mmol/L — AB (ref 101–111)
CHLORIDE: 91 mmol/L — AB (ref 101–111)
CO2: 25 mmol/L (ref 22–32)
CO2: 27 mmol/L (ref 22–32)
Calcium: 10.3 mg/dL (ref 8.9–10.3)
Calcium: 9.2 mg/dL (ref 8.9–10.3)
Creatinine, Ser: 9 mg/dL — ABNORMAL HIGH (ref 0.44–1.00)
Creatinine, Ser: 4.24 mg/dL — ABNORMAL HIGH (ref 0.44–1.00)
GFR calc Af Amer: 5 mL/min — ABNORMAL LOW (ref >60)
GFR calc non Af Amer: 5 mL/min — ABNORMAL LOW (ref >60)
GFR, EST AFRICAN AMERICAN: 13 mL/min — AB (ref >60)
GFR, EST NON AFRICAN AMERICAN: 12 mL/min — AB (ref >60)
GLUCOSE: 269 mg/dL — AB (ref 65–99)
Glucose, Bld: 145 mg/dL — ABNORMAL HIGH (ref 65–99)
POTASSIUM: 7.1 mmol/L — AB (ref 3.5–5.1)
POTASSIUM: 3.9 mmol/L (ref 3.5–5.1)
SODIUM: 133 mmol/L — AB (ref 135–145)
SODIUM: 133 mmol/L — AB (ref 135–145)
TOTAL PROTEIN: 8.1 g/dL (ref 6.5–8.1)
Total Bilirubin: 1.3 mg/dL — ABNORMAL HIGH (ref 0.3–1.2)
Total Protein: 7 g/dL (ref 6.5–8.1)

## 2015-07-12 LAB — POTASSIUM: POTASSIUM: 4.4 mmol/L (ref 3.5–5.1)

## 2015-07-12 LAB — GASTROINTESTINAL PANEL BY PCR, STOOL (REPLACES STOOL CULTURE)
Adenovirus F40/41: NOT DETECTED
Astrovirus: NOT DETECTED
CAMPYLOBACTER SPECIES: NOT DETECTED
CRYPTOSPORIDIUM: NOT DETECTED
Cyclospora cayetanensis: NOT DETECTED
E. COLI O157: NOT DETECTED
ENTAMOEBA HISTOLYTICA: NOT DETECTED
Enteroaggregative E coli (EAEC): NOT DETECTED
Enteropathogenic E coli (EPEC): DETECTED — AB
Enterotoxigenic E coli (ETEC): NOT DETECTED
Giardia lamblia: NOT DETECTED
NOROVIRUS GI/GII: NOT DETECTED
PLESIMONAS SHIGELLOIDES: NOT DETECTED
Rotavirus A: NOT DETECTED
SALMONELLA SPECIES: NOT DETECTED
SAPOVIRUS (I, II, IV, AND V): NOT DETECTED
SHIGELLA/ENTEROINVASIVE E COLI (EIEC): NOT DETECTED
Shiga like toxin producing E coli (STEC): NOT DETECTED
Vibrio cholerae: NOT DETECTED
Vibrio species: NOT DETECTED
Yersinia enterocolitica: NOT DETECTED

## 2015-07-12 LAB — BASIC METABOLIC PANEL
ANION GAP: 19 — AB (ref 5–15)
ANION GAP: 18 — AB (ref 5–15)
BUN: 100 mg/dL — ABNORMAL HIGH (ref 6–20)
BUN: 104 mg/dL — ABNORMAL HIGH (ref 6–20)
CHLORIDE: 90 mmol/L — AB (ref 101–111)
CHLORIDE: 94 mmol/L — AB (ref 101–111)
CO2: 24 mmol/L (ref 22–32)
CO2: 21 mmol/L — ABNORMAL LOW (ref 22–32)
Calcium: 9.8 mg/dL (ref 8.9–10.3)
Calcium: 9.9 mg/dL (ref 8.9–10.3)
Creatinine, Ser: 8.97 mg/dL — ABNORMAL HIGH (ref 0.44–1.00)
Creatinine, Ser: 8.77 mg/dL — ABNORMAL HIGH (ref 0.44–1.00)
GFR calc Af Amer: 5 mL/min — ABNORMAL LOW (ref >60)
GFR calc Af Amer: 6 mL/min — ABNORMAL LOW (ref >60)
GFR, EST NON AFRICAN AMERICAN: 5 mL/min — AB (ref >60)
GFR, EST NON AFRICAN AMERICAN: 5 mL/min — AB (ref >60)
GLUCOSE: 283 mg/dL — AB (ref 65–99)
Glucose, Bld: 145 mg/dL — ABNORMAL HIGH (ref 65–99)
POTASSIUM: 7.2 mmol/L — AB (ref 3.5–5.1)
POTASSIUM: 6.8 mmol/L — AB (ref 3.5–5.1)
SODIUM: 133 mmol/L — AB (ref 135–145)
Sodium: 133 mmol/L — ABNORMAL LOW (ref 135–145)

## 2015-07-12 LAB — CBC
HEMATOCRIT: 39.1 % (ref 35.0–47.0)
HEMOGLOBIN: 12.8 g/dL (ref 12.0–16.0)
MCH: 29.2 pg (ref 26.0–34.0)
MCHC: 32.7 g/dL (ref 32.0–36.0)
MCV: 89.3 fL (ref 80.0–100.0)
Platelets: 114 10*3/uL — ABNORMAL LOW (ref 150–440)
RBC: 4.37 MIL/uL (ref 3.80–5.20)
RDW: 14.7 % — ABNORMAL HIGH (ref 11.5–14.5)
WBC: 7.2 10*3/uL (ref 3.6–11.0)

## 2015-07-12 LAB — GLUCOSE, CAPILLARY
GLUCOSE-CAPILLARY: 239 mg/dL — AB (ref 65–99)
GLUCOSE-CAPILLARY: 122 mg/dL — AB (ref 65–99)
GLUCOSE-CAPILLARY: 125 mg/dL — AB (ref 65–99)
GLUCOSE-CAPILLARY: 134 mg/dL — AB (ref 65–99)
GLUCOSE-CAPILLARY: 134 mg/dL — AB (ref 65–99)
GLUCOSE-CAPILLARY: 146 mg/dL — AB (ref 65–99)
GLUCOSE-CAPILLARY: 187 mg/dL — AB (ref 65–99)
GLUCOSE-CAPILLARY: 214 mg/dL — AB (ref 65–99)

## 2015-07-12 LAB — PHOSPHORUS: Phosphorus: 8 mg/dL — ABNORMAL HIGH (ref 2.5–4.6)

## 2015-07-12 LAB — C DIFFICILE QUICK SCREEN W PCR REFLEX
C DIFFICILE (CDIFF) TOXIN: NEGATIVE
C DIFFICLE (CDIFF) ANTIGEN: NEGATIVE
C Diff interpretation: NEGATIVE

## 2015-07-12 LAB — TROPONIN I
TROPONIN I: 0.08 ng/mL — AB (ref <0.031)
Troponin I: 0.05 ng/mL — ABNORMAL HIGH (ref <0.031)
Troponin I: 0.04 ng/mL — ABNORMAL HIGH (ref <0.031)

## 2015-07-12 LAB — MRSA PCR SCREENING: MRSA by PCR: POSITIVE — AB

## 2015-07-12 MED ORDER — HEPARIN SODIUM (PORCINE) 5000 UNIT/ML IJ SOLN
5000.0000 [IU] | Freq: Three times a day (TID) | INTRAMUSCULAR | Status: DC
  Administered 2015-07-12 (×3): 5000 [IU] via SUBCUTANEOUS
  Filled 2015-07-12 (×3): qty 1

## 2015-07-12 MED ORDER — ASPIRIN EC 81 MG PO TBEC
81.0000 mg | DELAYED_RELEASE_TABLET | Freq: Every day | ORAL | Status: DC
  Administered 2015-07-13: 81 mg via ORAL
  Filled 2015-07-12 (×2): qty 1

## 2015-07-12 MED ORDER — INSULIN ASPART 100 UNIT/ML ~~LOC~~ SOLN
8.0000 [IU] | Freq: Once | SUBCUTANEOUS | Status: AC
  Administered 2015-07-12: 8 [IU] via INTRAVENOUS
  Filled 2015-07-12: qty 8

## 2015-07-12 MED ORDER — MUPIROCIN 2 % EX OINT
1.0000 "application " | TOPICAL_OINTMENT | Freq: Two times a day (BID) | CUTANEOUS | Status: DC
  Administered 2015-07-12 – 2015-07-13 (×3): 1 via NASAL
  Filled 2015-07-12 (×2): qty 22

## 2015-07-12 MED ORDER — ACETAMINOPHEN 325 MG PO TABS: 650.0000 mg | ORAL_TABLET | Freq: Four times a day (QID) | ORAL | Status: DC | PRN

## 2015-07-12 MED ORDER — INSULIN ASPART 100 UNIT/ML ~~LOC~~ SOLN
0.0000 [IU] | Freq: Three times a day (TID) | SUBCUTANEOUS | Status: DC
  Administered 2015-07-13: 2 [IU] via SUBCUTANEOUS
  Filled 2015-07-12 (×3): qty 2

## 2015-07-12 MED ORDER — CHLORHEXIDINE GLUCONATE CLOTH 2 % EX PADS
6.0000 | MEDICATED_PAD | Freq: Every day | CUTANEOUS | Status: DC
  Administered 2015-07-13: 6 via TOPICAL

## 2015-07-12 MED ORDER — SODIUM CHLORIDE 0.9% FLUSH
3.0000 mL | Freq: Two times a day (BID) | INTRAVENOUS | Status: DC
  Administered 2015-07-12 – 2015-07-13 (×2): 3 mL via INTRAVENOUS

## 2015-07-12 MED ORDER — DEXTROSE 50 % IV SOLN
25.0000 g | Freq: Once | INTRAVENOUS | Status: AC
  Administered 2015-07-12: 25 g via INTRAVENOUS
  Filled 2015-07-12: qty 50

## 2015-07-12 MED ORDER — HYDROCORTISONE 2.5 % RE CREA
TOPICAL_CREAM | Freq: Two times a day (BID) | RECTAL | Status: DC
  Administered 2015-07-12 – 2015-07-13 (×3): via RECTAL
  Filled 2015-07-12 (×2): qty 28.35

## 2015-07-12 MED ORDER — SODIUM CHLORIDE 0.9 % IV SOLN
INTRAVENOUS | Status: DC
  Administered 2015-07-12 – 2015-07-13 (×2): via INTRAVENOUS

## 2015-07-12 MED ORDER — SODIUM CHLORIDE 0.9 % IV BOLUS (SEPSIS)
1000.0000 mL | Freq: Once | INTRAVENOUS | Status: AC
  Administered 2015-07-12: 1000 mL via INTRAVENOUS

## 2015-07-12 MED ORDER — WITCH HAZEL-GLYCERIN EX PADS
MEDICATED_PAD | CUTANEOUS | Status: DC | PRN
  Administered 2015-07-12: 21:00:00 via TOPICAL
  Filled 2015-07-12: qty 100

## 2015-07-12 MED ORDER — HEPARIN SODIUM (PORCINE) 1000 UNIT/ML DIALYSIS
1000.0000 [IU] | INTRAMUSCULAR | Status: DC | PRN
  Filled 2015-07-12: qty 1

## 2015-07-12 MED ORDER — LIDOCAINE-PRILOCAINE 2.5-2.5 % EX CREA
TOPICAL_CREAM | Freq: Every day | CUTANEOUS | Status: DC | PRN
  Filled 2015-07-12: qty 5

## 2015-07-12 MED ORDER — METRONIDAZOLE IN NACL 5-0.79 MG/ML-% IV SOLN
500.0000 mg | Freq: Three times a day (TID) | INTRAVENOUS | Status: DC
  Filled 2015-07-12 (×2): qty 100

## 2015-07-12 MED ORDER — SODIUM CHLORIDE 0.9 % IV SOLN
1.0000 g | Freq: Once | INTRAVENOUS | Status: AC
  Administered 2015-07-12: 1 g via INTRAVENOUS
  Filled 2015-07-12: qty 10

## 2015-07-12 MED ORDER — ACETAMINOPHEN 650 MG RE SUPP: 650.0000 mg | Freq: Four times a day (QID) | RECTAL | Status: DC | PRN

## 2015-07-12 MED ORDER — INSULIN ASPART 100 UNIT/ML ~~LOC~~ SOLN
SUBCUTANEOUS | Status: AC
  Administered 2015-07-12: 8 [IU] via INTRAVENOUS
  Filled 2015-07-12: qty 8

## 2015-07-12 MED ORDER — INSULIN ASPART 100 UNIT/ML ~~LOC~~ SOLN
0.0000 [IU] | Freq: Every day | SUBCUTANEOUS | Status: DC
  Administered 2015-07-12: 2 [IU] via SUBCUTANEOUS
  Filled 2015-07-12: qty 2

## 2015-07-12 MED ORDER — ONDANSETRON HCL 4 MG PO TABS: 4.0000 mg | ORAL_TABLET | Freq: Four times a day (QID) | ORAL | Status: DC | PRN

## 2015-07-12 MED ORDER — ONDANSETRON HCL 4 MG/2ML IJ SOLN: 4.0000 mg | Freq: Four times a day (QID) | INTRAMUSCULAR | Status: DC | PRN

## 2015-07-12 MED ORDER — CIPROFLOXACIN IN D5W 400 MG/200ML IV SOLN
400.0000 mg | INTRAVENOUS | Status: DC
  Administered 2015-07-12: 400 mg via INTRAVENOUS
  Filled 2015-07-12 (×2): qty 200

## 2015-07-12 MED ORDER — PRAMOXINE-ZINC OXIDE IN MO 1-12.5 % RE OINT: TOPICAL_OINTMENT | Freq: Three times a day (TID) | RECTAL | Status: DC | PRN

## 2015-07-12 MED ORDER — PRAMOXINE HCL 1 % RE FOAM
Freq: Three times a day (TID) | RECTAL | Status: DC | PRN
  Filled 2015-07-12: qty 15

## 2015-07-12 NOTE — Progress Notes (Signed)
Report given to [place], rn resuming care of pt.  Pt is alert and oriented, requires spanish interpreter.  NSR, lungs clear on room air.  VSS, afebrile.  Pt received hemodialysis, 1L removed.  PT to be transferred to 2A when bed available.

## 2015-07-12 NOTE — Progress Notes (Signed)
Pre-hd tx 

## 2015-07-12 NOTE — H&P (Signed)
[facility] [place] [facility]   PATIENT NAME: Stephanie LatinoSonia Reyes Curtis    MR#:  [id number]  DATE OF BIRTH:  1970/09/02  DATE OF ADMISSION:  07/12/2015  PRIMARY CARE PHYSICIAN: No PCP Per Patient   REQUESTING/REFERRING PHYSICIAN:   CHIEF COMPLAINT:   Chief Complaint  Patient presents with  . Diarrhea    HISTORY OF PRESENT ILLNESS: Stephanie LatinoSonia Reyes Curtis  is a 45 y.o. female with a known history of End-stage renal is on dialysis, diabetes mellitus, hyperlipidemia, neuropathy presented to the emergency room with diarrhea and rectal pain. The diarrhea has been going on for the last 2 weeks and was watery diarrhea. No history of any use of antibiotics recently. No history of recent travel or sick contacts at home. Patient also has some rectal pain which is secondary to hemorrhoids. No history of any rectal bleed. No history of nausea or vomiting hematemesis or hemoptysis. Patient was evaluated in the emergency room she was found to have elevated potassium of more than 7. Patient was given IV calcium gluconate, IV dextrose and  IV insulin in the emergency room for hyperkalemia. EKG showed left bundle branch block and we do not have any old EKG to compare. Patient is kept on enteric precautions and stool for C. difficile toxin was ordered. No history of any abdominal pain. No complaints of chest pain. No history of any shortness of breath. No history of headache dizziness or blurry vision. No history of syncope or seizure.  PAST MEDICAL HISTORY:   Past Medical History  Diagnosis Date  . ESRD on dialysis (HCC)   . Diabetes 1.5, managed as type 2 (HCC)   . Hyperlipidemia   . Neuropathy (HCC)     PAST SURGICAL HISTORY: Past Surgical History  Procedure Laterality Date  . Leg surgery    . Av fistula placement      SOCIAL HISTORY:  Social History  Substance Use Topics  . Smoking status: Never Smoker   . Smokeless tobacco: Not on file  . Alcohol Use: No    FAMILY  HISTORY:  Family History  Problem Relation Age of Onset  . Diabetes Mellitus II Father     DRUG ALLERGIES: No Known Allergies  REVIEW OF SYSTEMS:   CONSTITUTIONAL: No fever, has weakness.  EYES: No blurred or double vision.  EARS, NOSE, AND THROAT: No tinnitus or ear pain.  RESPIRATORY: No cough, shortness of breath, wheezing or hemoptysis.  CARDIOVASCULAR: No chest pain, orthopnea, edema.  GASTROINTESTINAL: No nausea, vomiting, has diarrhea, no abdominal pain.  GENITOURINARY: No dysuria, hematuria. Has rectal pain ENDOCRINE: No polyuria, nocturia,  HEMATOLOGY: No anemia, easy bruising or bleeding SKIN: No rash or lesion. MUSCULOSKELETAL: No joint pain or arthritis.   NEUROLOGIC: No tingling, numbness, weakness.  PSYCHIATRY: No anxiety or depression.   MEDICATIONS AT HOME:  Prior to Admission medications   Not on File      PHYSICAL EXAMINATION:   VITAL SIGNS: Blood pressure 161/66, pulse 65, temperature 97.5 F (36.4 C), temperature source Oral, resp. rate 20, weight 78 kg (171 lb 15.3 oz), SpO2 100 %.  GENERAL:  45 y.o.-year-old patient lying in the bed with no acute distress.  EYES: Pupils equal, round, reactive to light and accommodation. No scleral icterus. Extraocular muscles intact.  HEENT: Head atraumatic, normocephalic. Oropharynx and nasopharynx clear.  NECK:  Supple, no jugular venous distention. No thyroid enlargement, no tenderness.  LUNGS: Normal breath sounds bilaterally, no wheezing, rales,rhonchi or crepitation. No use of  accessory muscles of respiration.  CARDIOVASCULAR: S1, S2 normal. No murmurs, rubs, or gallops.  ABDOMEN: Soft, nontender, nondistended. Bowel sounds present. No organomegaly or mass.  EXTREMITIES: No pedal edema, cyanosis, or clubbing.  NEUROLOGIC: Cranial nerves II through XII are intact. Muscle strength 5/5 in all extremities. Sensation intact. Gait not checked.  PSYCHIATRIC: The patient is alert and oriented x 3.  SKIN: No obvious  rash, lesion, or ulcer.   LABORATORY PANEL:   CBC  Recent Labs Lab 07/11/15 2244  WBC 8.6  HGB 13.8  HCT 41.6  PLT 124*  MCV 88.3  MCH 29.2  MCHC 33.1  RDW 15.1*   ------------------------------------------------------------------------------------------------------------------  Chemistries   Recent Labs Lab 07/11/15 2244 07/12/15 0105  NA 133* 133*  K >7.5* 7.2*  CL 88* 90*  CO2 25 24  GLUCOSE 269* 283*  BUN 92* 100*  CREATININE 9.00* 8.97*  CALCIUM 10.3 9.8  AST 14*  --   ALT 15  --   ALKPHOS 149*  --   BILITOT 0.9  --    ------------------------------------------------------------------------------------------------------------------ CrCl cannot be calculated (Unknown ideal weight.). ------------------------------------------------------------------------------------------------------------------ No results for input(s): TSH, T4TOTAL, T3FREE, THYROIDAB in the last 72 hours.  Invalid input(s): FREET3   Coagulation profile No results for input(s): INR, PROTIME in the last 168 hours. ------------------------------------------------------------------------------------------------------------------- No results for input(s): DDIMER in the last 72 hours. -------------------------------------------------------------------------------------------------------------------  Cardiac Enzymes No results for input(s): CKMB, TROPONINI, MYOGLOBIN in the last 168 hours.  Invalid input(s): CK ------------------------------------------------------------------------------------------------------------------ Invalid input(s): POCBNP  ---------------------------------------------------------------------------------------------------------------  Urinalysis No results found for: COLORURINE, APPEARANCEUR, LABSPEC, PHURINE, GLUCOSEU, HGBUR, BILIRUBINUR, KETONESUR, PROTEINUR, UROBILINOGEN, NITRITE, LEUKOCYTESUR   RADIOLOGY: No results found.  EKG: Orders placed or  performed during the hospital encounter of 07/12/15  . ED EKG  . ED EKG  . EKG 12-Lead  . EKG 12-Lead    IMPRESSION AND PLAN: 45 year old female patient with history of end-stage renal disease on dialysis, neuropathy, diabetes mellitus presented to the emergency room with diarrhea and rectal pain. Admitting diagnosis 1. Acute hyperkalemia 2. End-stage renal disease on dialysis 3. Diarrhea 4. Diabetes mellitus type 2 5. Neuropathy 6. Hyponatremia Treatment plan Admit patient to ICU stepdown unit Gentle IV fluid hydration Nephrology consultation for dialysis Patient received IV calcium gluconate, IV dextrose and IV insulin for elevated potassium Stool for C. difficile toxin and enteric precautions Follow-up sodium level and potassium level DVT prophylaxis subcutaneous heparin  check troponin for ischemia  All the records are reviewed and case discussed with ED provider. Management plans discussed with the patient, family and they are in agreement.  CODE STATUS:FULL Code Status History    This patient does not have a recorded code status. Please follow your organizational policy for patients in this situation.       TOTAL CRITICAL CARE TIME TAKING CARE OF THIS PATIENT: 52 minutes.    Ihor Austin M.D on 07/12/2015 at 2:36 AM  Between 7am to 6pm - Pager - [phone]  After 6pm go to [email or web address] - password [email or web address] [facility]  [facility]  Office  [phone]  CC: Primary care physician; No PCP Per Patient

## 2015-07-12 NOTE — Progress Notes (Signed)
Report given to receiving nurse on 2A. Pt in stable condition and transferred.

## 2015-07-12 NOTE — Progress Notes (Signed)
Hemodialysis start 

## 2015-07-12 NOTE — ED Provider Notes (Signed)
[facility] Emergency Department Provider Note  Time seen: 1:03 AM  I have reviewed the triage vital signs and the nursing notes.   HISTORY  Chief Complaint Diarrhea    HPI [name] is a 45 y.o. female with a past medical history of end-stage renal disease on hemodialysis Tuesday, Thursday, Saturday presents to the emergency department 2 weeks of diarrhea and rectal pain. According to the patient for the past 2 weeks she has been having diarrhea with frequent bowel movements each day. She states she has been also developing rectal pain which has progressively worsened. Denies any abdominal pain. Does state a feeling of generalized weakness as well as lower extremity tingling which she states comes and goes, and is fairly normal for her. Patient denies fever, nausea or vomiting. Patient describes rectal pain as moderate. Worse when she is having a bowel movement.     No past medical history on file.  There are no active problems to display for this patient.   No past surgical history on file.  No current outpatient prescriptions on file.  Allergies Review of patient's allergies indicates no known allergies.  No family history on file.  Social History Social History  Substance Use Topics  . Smoking status: Not on file  . Smokeless tobacco: Not on file  . Alcohol Use: Not on file    Review of Systems Constitutional: Negative for fever. Cardiovascular: Negative for chest pain. Respiratory: Negative for shortness of breath. Gastrointestinal: Negative for abdominal pain. Negative for nausea or vomiting. Positive for diarrhea. Genitourinary: Does not produce urine. Musculoskeletal: Negative for back pain. Neurological: Negative for headache 10-point ROS otherwise negative.  ____________________________________________   PHYSICAL EXAM:  VITAL SIGNS: ED Triage Vitals  Enc Vitals Group     BP 07/11/15 2242 161/66 mmHg     Pulse Rate  07/11/15 2240 65     Resp 07/11/15 2240 20     Temp 07/11/15 2240 97.5 F (36.4 C)     Temp Source 07/11/15 2240 Oral     SpO2 07/11/15 2240 100 %     Weight 07/11/15 2237 171 lb 15.3 oz (78 kg)     Height --      Head Cir --      Peak Flow --      Pain Score 07/11/15 2238 10     Pain Loc --      Pain Edu? --      Excl. in GC? --     Constitutional: Alert and oriented. Well appearing and in no distress. Eyes: Normal exam ENT   Head: Normocephalic and atraumatic.   Mouth/Throat: Mucous membranes are moist. Cardiovascular: Normal rate, regular rhythm. Respiratory: Normal respiratory effort without tachypnea nor retractions. Breath sounds are clear  Gastrointestinal: Soft and nontender. No distention. Mild hemorrhoids, nondistended, non-thrombosed. No obvious anal tear or fissure.  Musculoskeletal: Nontender with normal range of motion in all extremities. Neurologic:  Normal speech. No gross focal neurologic deficits Skin:  Skin is warm, dry and intact.  Psychiatric: Mood and affect are normal.   ____________________________________________    EKG  EKG reviewed and interpreted by myself shows normal sinus rhythm at 69 bpm, widened QRS, left axis deviation, nonspecific ST changes most consistent with left bundle branch block. This is changed from prior EKG however her prior EKG is from 2011, the patient has been on dialysis for 3 years.  ____________________________________________    INITIAL IMPRESSION / ASSESSMENT AND PLAN / ED COURSE  Pertinent labs & imaging results that were available during my care of the patient were reviewed by me and considered in my medical decision making (see chart for details).  Patient presents the emergency department with diarrhea 2 weeks and rectal pain. On exam the patient does have mild hemorrhoids but none of which appear thrombosed or over the painful on exam. No obvious fissure or tear. More concerning however the patient's  laboratory workup which was a potassium greater than 7.5.Patient's EKG shows a widened QRS most consistent with left bundle branch block. Patient has no history of left bundle branch block that I can find on prior EKG but her last EKG was 2011. While in the patient's room the patient. He briefly converted to an atrial fibrillation in the back to a sinus rhythm, given the possible EKG changes with hyperkalemia we will start IV fluids, dose 1 g of calcium gluconate, dose insulin glucose, continue with hourly CBGs. Patient will likely require admission to the hospital for urgent dialysis. We will repeat the patient's potassium level to ensure it is accurate.   Repeat potassium greater than 7.0. Patient receiving insulin/glucose, calcium gluconate and IV fluids. We will admit to the hospitalist for further treatment and dialysis.     CRITICAL CARE Performed by: Minna AntisPADUCHOWSKI, Finch Costanzo   Total critical care time: 60 minutes  Critical care time was exclusive of separately billable procedures and treating other patients.  Critical care was necessary to treat or prevent imminent or life-threatening deterioration.  Critical care was time spent personally by me on the following activities: development of treatment plan with patient and/or surrogate as well as nursing, discussions with consultants, evaluation of patient's response to treatment, examination of patient, obtaining history from patient or surrogate, ordering and performing treatments and interventions, ordering and review of laboratory studies, ordering and review of radiographic studies, pulse oximetry and re-evaluation of patient's condition.   ____________________________________________   FINAL CLINICAL IMPRESSION(S) / ED DIAGNOSES  Diarrhea Rectal pain Hyperkalemia  Minna AntisKevin Jaklyn Alen, MD 07/12/15 0157

## 2015-07-12 NOTE — Progress Notes (Signed)
Hemodialysis completed. 

## 2015-07-12 NOTE — Progress Notes (Signed)
Pt admitted with hyperkalemia. Down to 6.8. 0415 FBS 122 then 125, 134 this AM. Positive MRSA swab. Spanish speaking utilized interpreter for admission. Troponin 0.05. Continue on dialysis TID. AFIB once in ED but has been SR LBBB and PACs this shift.  No BM since pt admitted to the floor.

## 2015-07-12 NOTE — Progress Notes (Signed)
Post hd tx 

## 2015-07-12 NOTE — Progress Notes (Signed)
Notified Dr. Luberta MutterKonidena that pt GI panel PCR positive for Enteropathogenic E.coli.  Dr. Luberta MutterKonidena to enter orders.

## 2015-07-12 NOTE — Progress Notes (Signed)
Spoke with Dr. Luberta MutterKonidena via telephone regarding pt hyperkalemia.  Per Dr. Luberta MutterKonidena she is going to call Dr. Thedore MinsSingh with nephrology and have him see pt to see what plan is for pt (hemodialysis today?)

## 2015-07-12 NOTE — Progress Notes (Signed)
Updated patient and family on plan of care for the night using a Spanish interpreter. Patient and family have no concerns at this time. Nursing staff will continue to monitor. Lamonte RicherKara A Fredrik Mogel, RN

## 2015-07-12 NOTE — Progress Notes (Signed)
[facility] [place] at [facility]   PATIENT NAME: [name]    MR#:  [id number]  DATE OF BIRTH:  08/16/1970  SUBJECTIVE: Admitted for diarrhea, severe hyperkalemia. Potassium more than 7 on admission. Repeat the potassium 4.5, getting hemodialysis now. Has been having diarrhea for 3 days. Stool C. difficile negative. No nausea no vomiting abdominal pain. Patient's last hemodialysis was Saturday but did not finish the dialysis. Spoke with the help of Engineer, structuralpanish translator.   CHIEF COMPLAINT:   Chief Complaint  Patient presents with  . Diarrhea    REVIEW OF SYSTEMS:   ROS CONSTITUTIONAL: Some fatigue, weakness. EYES: No blurred or double vision.  EARS, NOSE, AND THROAT: No tinnitus or ear pain.  RESPIRATORY: No cough, shortness of breath, wheezing or hemoptysis.  CARDIOVASCULAR: No chest pain, orthopnea, edema.  GASTROINTESTINAL:has, diarrhea or abdominal pain. Complaints of burning in the rectal region GENITOURINARY: No dysuria, hematuria.  ENDOCRINE: No polyuria, nocturia,  HEMATOLOGY: No anemia, easy bruising or bleeding SKIN: No rash or lesion. MUSCULOSKELETAL: No joint pain or arthritis.   NEUROLOGIC: No tingling, numbness, weakness.  PSYCHIATRY: No anxiety or depression.   DRUG ALLERGIES:  No Known Allergies  VITALS:  Blood pressure 148/78, pulse 93, temperature 97.8 F (36.6 C), temperature source Oral, resp. rate 16, height 5\' 4"  (1.626 m), weight 78.6 kg (173 lb 4.5 oz), SpO2 98 %.  PHYSICAL EXAMINATION:  GENERAL:  45 y.o.-year-old patient lying in the bed with no acute distress. Appears dehydrated EYES: Pupils equal, round, reactive to light and accommodation. No scleral icterus. Extraocular muscles intact.  HEENT: Head atraumatic, normocephalic. Oropharynx and nasopharynx clear.  NECK:  Supple, no jugular venous distention. No thyroid enlargement, no tenderness.  LUNGS: Normal breath sounds bilaterally, no wheezing, rales,rhonchi or  crepitation. No use of accessory muscles of respiration.  CARDIOVASCULAR: S1, S2 normal. No murmurs, rubs, or gallops.  ABDOMEN: Soft, nontender, nondistended. Bowel sounds present. No organomegaly or mass.  EXTREMITIES: No pedal edema, cyanosis, or clubbing.  NEUROLOGIC: Cranial nerves II through XII are intact. Muscle strength 5/5 in all extremities. Sensation intact. Gait not checked.  PSYCHIATRIC: The patient is alert and oriented x 3.  SKIN: No obvious rash, lesion, or ulcer.    LABORATORY PANEL:   CBC  Recent Labs Lab 07/12/15 0425  WBC 7.2  HGB 12.8  HCT 39.1  PLT 114*   ------------------------------------------------------------------------------------------------------------------  Chemistries   Recent Labs Lab 07/11/15 2244  07/12/15 0425 07/12/15 1041  NA 133*  < > 133*  --   K 7.1*  < > 6.8* 4.4  CL 88*  < > 94*  --   CO2 25  < > 21*  --   GLUCOSE 269*  < > 145*  --   BUN 92*  < > 104*  --   CREATININE 9.00*  < > 8.77*  --   CALCIUM 10.3  < > 9.9  --   AST 14*  --   --   --   ALT 15  --   --   --   ALKPHOS 149*  --   --   --   BILITOT 0.9  --   --   --   < > = values in this interval not displayed. ------------------------------------------------------------------------------------------------------------------  Cardiac Enzymes  Recent Labs Lab 07/12/15 1001  TROPONINI 0.04*   ------------------------------------------------------------------------------------------------------------------  RADIOLOGY:  No results found.  EKG:   Orders placed or performed during the hospital encounter of 07/12/15  .  ED EKG  . ED EKG  . EKG 12-Lead  . EKG 12-Lead    ASSESSMENT AND PLAN:   Acute severe hyperkalemia: Due to incomplete hemodialysis recently. Patient is here with insulin, dextrose, calcium gluconate in the emergency room admitted to ICU, patient receiving emergency hemodialysis, repeat potassium is better less than 5. Transfer the patient to  telemetry. #2 ESRD on hemodialysis Tuesday, Thursday, Saturday. Because of severe hyperkalemia he received emergency hemodialysis today. #3. Acute severe diarrhea secondary to entero pathogenic  coli: Start the Flagyl,  , consult ID. 4.DMII; patient is on Lantus, sliding insulin coverage at home. Continue sliding scale at the coverage, start the Lantus from tomorrow.  Po intake is not good yet. 5.h/o  Hemorrhoids.: Continue Anusol cream. Discussed with the son and patient with the help of Spanish translator All the records are reviewed and case discussed with Care Management/Social Workerr. Management plans discussed with the patient, family and they are in agreement.  CODE STATUS: full  TOTAL TIME TAKING CARE OF THIS PATIENT: .   POSSIBLE D/C IN 1-2  DAYS, DEPENDING ON CLINICAL CONDITION.   Katha Hamming M.D on 07/12/2015 at 1:11 PM  Between 7am to 6pm - Pager - [phone]  After 6pm go to [email or web address] - password [email or web address] [facility]  [facility] [place] [facility]  Office  [phone]  CC: Primary care physician; No PCP Per Patient   Note: This dictation was prepared with Dragon dictation along with smaller phrase technology. Any transcriptional errors that result from this process are unintentional.

## 2015-07-12 NOTE — Progress Notes (Signed)
Subjective:  Patient known to our practice from previous admissions She presents for rectal pain for 2 weeks Pain is worse with stools No N/V No blood in stool Able to eat normal diet Noted to have a severely elevated potassium in ER Nephrology consulted for urgent HD Potassium level is critically high as noted below  Objective:  Vital signs in last 24 hours:  Temp:  [97.5 F (36.4 C)-97.6 F (36.4 C)] 97.6 F (36.4 C) (05/16 0415) Pulse Rate:  [61-88] 84 (05/16 0835) Resp:  [13-23] 23 (05/16 0835) BP: (136-184)/(66-111) 136/111 mmHg (05/16 0835) SpO2:  [96 %-100 %] 100 % (05/16 0835) Weight:  [78 kg (171 lb 15.3 oz)-78.6 kg (173 lb 4.5 oz)] 78.6 kg (173 lb 4.5 oz) (05/16 0415)  Weight change:  Filed Weights   07/11/15 2237 07/12/15 0415  Weight: 78 kg (171 lb 15.3 oz) 78.6 kg (173 lb 4.5 oz)    Intake/Output:    Intake/Output Summary (Last 24 hours) at 07/12/15 0854 Last data filed at 07/12/15 0800  Gross per 24 hour  Intake 181.67 ml  Output      0 ml  Net 181.67 ml     Physical Exam: General: No acute distress, laying in the bed   HEENT Anicteric sclera   Neck Supple   Pulm/lungs Clear to auscultation bilaterally   CVS/Heart Irregular, no rub or gallop   Abdomen:  Soft, nontender, nondistended   Extremities: No peripheral edema   Neurologic: Alert, oriented   Skin: No acute rashes   Access: Left upper extremity AV fistula        Basic Metabolic Panel:   Recent Labs Lab 07/11/15 2244 07/12/15 0105 07/12/15 0425  NA 133* 133* 133*  K 7.1* 7.2* 6.8*  CL 88* 90* 94*  CO2 25 24 21*  GLUCOSE 269* 283* 145*  BUN 92* 100* 104*  CREATININE 9.00* 8.97* 8.77*  CALCIUM 10.3 9.8 9.9     CBC:  Recent Labs Lab 07/11/15 2244 07/12/15 0425  WBC 8.6 7.2  HGB 13.8 12.8  HCT 41.6 39.1  MCV 88.3 89.3  PLT 124* 114*      Microbiology:  Recent Results (from the past 720 hour(s))  MRSA PCR Screening     Status: Abnormal   Collection Time:  07/12/15  4:58 AM  Result Value Ref Range Status   MRSA by PCR POSITIVE (A) NEGATIVE Final    Comment:        The GeneXpert MRSA Assay (FDA approved for NASAL specimens only), is one component of a comprehensive MRSA colonization surveillance program. It is not intended to diagnose MRSA infection nor to guide or monitor treatment for MRSA infections. CALLED TO FELICIA PREUDHOMME @ [id number] ON 07/12/2015 BY CAF     Coagulation Studies: No results for input(s): LABPROT, INR in the last 72 hours.  Urinalysis: No results for input(s): COLORURINE, LABSPEC, PHURINE, GLUCOSEU, HGBUR, BILIRUBINUR, KETONESUR, PROTEINUR, UROBILINOGEN, NITRITE, LEUKOCYTESUR in the last 72 hours.  Invalid input(s): APPERANCEUR    Imaging: No results found.   Medications:   . sodium chloride 50 mL/hr at 07/12/15 0800   . aspirin EC  81 mg Oral Daily  . heparin  5,000 Units Subcutaneous Q8H  . insulin aspart  0-15 Units Subcutaneous TID WC  . insulin aspart  0-5 Units Subcutaneous QHS  . sodium chloride flush  3 mL Intravenous Q12H   acetaminophen **OR** acetaminophen, ondansetron **OR** ondansetron (ZOFRAN) IV  Assessment/ Plan:  45 y.o. female with history of end-stage  renal disease on dialysis, neuropathy, diabetes mellitus presented to the emergency room with diarrhea and rectal pain.  1. ESRD. [facility]/  2. Severe Hyperkalemia 3. DM-2 with complications, CKD 4. SHPTH 5. MRSA screen positive  - urgent HD this AM for hyperkalemia - 1 k bath until K < 5 - monitor phos        LOS: 0 Pier Bosher 5/16/20178:54 AM

## 2015-07-13 LAB — GLUCOSE, CAPILLARY: Glucose-Capillary: 136 mg/dL — ABNORMAL HIGH (ref 65–99)

## 2015-07-13 LAB — HEPATITIS B SURFACE ANTIGEN: HEP B S AG: NEGATIVE

## 2015-07-13 MED ORDER — CIPROFLOXACIN HCL 500 MG PO TABS: 500.0000 mg | ORAL_TABLET | Freq: Two times a day (BID) | ORAL | Status: DC

## 2015-07-13 MED ORDER — MUPIROCIN 2 % EX OINT: 1.0000 "application " | TOPICAL_OINTMENT | Freq: Two times a day (BID) | CUTANEOUS | Status: DC

## 2015-07-13 MED ORDER — WITCH HAZEL-GLYCERIN EX PADS
MEDICATED_PAD | CUTANEOUS | Status: DC | PRN
Start: 2015-07-13 — End: 2015-12-17

## 2015-07-13 MED ORDER — METOPROLOL SUCCINATE ER 25 MG PO TB24: 25.0000 mg | ORAL_TABLET | Freq: Every day | ORAL | Status: DC

## 2015-07-13 MED ORDER — HYDROCORTISONE 2.5 % RE CREA
TOPICAL_CREAM | Freq: Two times a day (BID) | RECTAL | Status: DC
Start: 2015-07-13 — End: 2015-12-17

## 2015-07-13 MED ORDER — DOCUSATE SODIUM 100 MG PO CAPS: 100.0000 mg | ORAL_CAPSULE | Freq: Two times a day (BID) | ORAL | Status: DC

## 2015-07-13 NOTE — Care Management (Signed)
Spoke with daughter. [facility] by Dr. Manfred ArchMancharo. She typically pays out of pocket for her medications. Application given for medication management clinic. Daughter will complete and take to clinic. Daughter denies issues paying for medications. They are on the $4 list at [facility]. Coupon given for metoprolol which is $15.91 with coupon. No other home health needs anticipated.

## 2015-07-13 NOTE — Progress Notes (Signed)
Subjective:  Patient known to our practice from previous admissions She presents for rectal pain for 2 weeks Pain is worse with stools   Able to eat normal diet Noted to have a severely elevated potassium in ER Now improved with dialysis Patient looks and feels better today. Able to eat without any nausea or vomiting  Objective:  Vital signs in last 24 hours:  Temp:  [97.7 F (36.5 C)-98.2 F (36.8 C)] 97.8 F (36.6 C) (05/17 0946) Pulse Rate:  [83-99] 83 (05/17 0946) Resp:  [12-27] 19 (05/17 0431) BP: (127-169)/(56-95) 167/86 mmHg (05/17 0946) SpO2:  [93 %-100 %] 97 % (05/17 0946) Weight:  [77.6 kg (171 lb 1.2 oz)] 77.6 kg (171 lb 1.2 oz) (05/16 1330)  Weight change: 0.6 kg (1 lb 5.2 oz) Filed Weights   07/12/15 0415 07/12/15 0930 07/12/15 1330  Weight: 78.6 kg (173 lb 4.5 oz) 78.6 kg (173 lb 4.5 oz) 77.6 kg (171 lb 1.2 oz)    Intake/Output:    Intake/Output Summary (Last 24 hours) at 07/13/15 1041 Last data filed at 07/12/15 1330  Gross per 24 hour  Intake    150 ml  Output   1000 ml  Net   -850 ml     Physical Exam: General: No acute distress, laying in the bed   HEENT Anicteric sclera   Neck Supple   Pulm/lungs Clear to auscultation bilaterally   CVS/Heart Irregular, no rub or gallop   Abdomen:  Soft, nontender, nondistended   Extremities: No peripheral edema   Neurologic: Alert, oriented   Skin: No acute rashes   Access: Left upper extremity AV fistula        Basic Metabolic Panel:   Recent Labs Lab 07/11/15 2244 07/12/15 0105 07/12/15 0425 07/12/15 1001 07/12/15 1041 07/12/15 1614  NA 133* 133* 133*  --   --  133*  K 7.1* 7.2* 6.8*  --  4.4 3.9  CL 88* 90* 94*  --   --  91*  CO2 25 24 21*  --   --  27  GLUCOSE 269* 283* 145*  --   --  145*  BUN 92* 100* 104*  --   --  33*  CREATININE 9.00* 8.97* 8.77*  --   --  4.24*  CALCIUM 10.3 9.8 9.9  --   --  9.2  PHOS  --   --   --  8.0*  --   --      CBC:  Recent Labs Lab 07/11/15 2244  07/12/15 0425  WBC 8.6 7.2  HGB 13.8 12.8  HCT 41.6 39.1  MCV 88.3 89.3  PLT 124* 114*      Microbiology:  Recent Results (from the past 720 hour(s))  MRSA PCR Screening     Status: Abnormal   Collection Time: 07/12/15  4:58 AM  Result Value Ref Range Status   MRSA by PCR POSITIVE (A) NEGATIVE Final    Comment:        The GeneXpert MRSA Assay (FDA approved for NASAL specimens only), is one component of a comprehensive MRSA colonization surveillance program. It is not intended to diagnose MRSA infection nor to guide or monitor treatment for MRSA infections. CALLED TO FELICIA PREUDHOMME @ [id number] ON 07/12/2015 BY CAF   Gastrointestinal Panel by PCR , Stool     Status: Abnormal   Collection Time: 07/12/15 10:02 AM  Result Value Ref Range Status   Campylobacter species NOT DETECTED NOT DETECTED Final   Plesimonas shigelloides NOT DETECTED  NOT DETECTED Final   Salmonella species NOT DETECTED NOT DETECTED Final   Yersinia enterocolitica NOT DETECTED NOT DETECTED Final   Vibrio species NOT DETECTED NOT DETECTED Final   Vibrio cholerae NOT DETECTED NOT DETECTED Final   Enteroaggregative E coli (EAEC) NOT DETECTED NOT DETECTED Final   Enteropathogenic E coli (EPEC) DETECTED (A) NOT DETECTED Final    Comment: CRITICAL RESULT CALLED TO, READ BACK BY AND VERIFIED WITH: RN Lerry Paterson KEENE 07/12/15 1250 MLM    Enterotoxigenic E coli (ETEC) NOT DETECTED NOT DETECTED Final   Shiga like toxin producing E coli (STEC) NOT DETECTED NOT DETECTED Final   E. coli O157 NOT DETECTED NOT DETECTED Final   Shigella/Enteroinvasive E coli (EIEC) NOT DETECTED NOT DETECTED Final   Cryptosporidium NOT DETECTED NOT DETECTED Final   Cyclospora cayetanensis NOT DETECTED NOT DETECTED Final   Entamoeba histolytica NOT DETECTED NOT DETECTED Final   Giardia lamblia NOT DETECTED NOT DETECTED Final   Adenovirus F40/41 NOT DETECTED NOT DETECTED Final   Astrovirus NOT DETECTED NOT DETECTED Final   Norovirus  GI/GII NOT DETECTED NOT DETECTED Final   Rotavirus A NOT DETECTED NOT DETECTED Final   Sapovirus (I, II, IV, and V) NOT DETECTED NOT DETECTED Final  C difficile quick scan w PCR reflex     Status: None   Collection Time: 07/12/15 10:02 AM  Result Value Ref Range Status   C Diff antigen NEGATIVE NEGATIVE Final   C Diff toxin NEGATIVE NEGATIVE Final   C Diff interpretation Negative for C. difficile  Final    Coagulation Studies: No results for input(s): LABPROT, INR in the last 72 hours.  Urinalysis: No results for input(s): COLORURINE, LABSPEC, PHURINE, GLUCOSEU, HGBUR, BILIRUBINUR, KETONESUR, PROTEINUR, UROBILINOGEN, NITRITE, LEUKOCYTESUR in the last 72 hours.  Invalid input(s): APPERANCEUR    Imaging: No results found.   Medications:   . sodium chloride 50 mL/hr at 07/13/15 0021   . aspirin EC  81 mg Oral Daily  . Chlorhexidine Gluconate Cloth  6 each Topical Q0600  . ciprofloxacin  400 mg Intravenous Q24H  . heparin  5,000 Units Subcutaneous Q8H  . hydrocortisone   Rectal BID  . insulin aspart  0-15 Units Subcutaneous TID WC  . insulin aspart  0-5 Units Subcutaneous QHS  . mupirocin ointment  1 application Nasal BID  . sodium chloride flush  3 mL Intravenous Q12H   acetaminophen **OR** acetaminophen, heparin, lidocaine-prilocaine, ondansetron **OR** ondansetron (ZOFRAN) IV, pramoxine, witch hazel-glycerin  Assessment/ Plan:  45 y.o. female with history of end-stage renal disease on dialysis, neuropathy, diabetes mellitus presented to the emergency room with diarrhea and rectal pain.  1. ESRD. [facility]/  2. Severe Hyperkalemia 3. DM-2 with complications, CKD 4. SHPTH 5. MRSA screen positive  Potassium corrected Ok to discharge from renal standpoint Discussed with Dr Luberta Mutter to obtain surgical evaluation for hemorrhoids/rectal pain        LOS: 1 Kerim Statzer 5/17/201710:41 AM

## 2015-07-13 NOTE — Progress Notes (Signed)
D?C IV intact,

## 2015-07-13 NOTE — Discharge Summary (Signed)
[name], is a 45 y.o. female  DOB Jul 03, 1970  MRN [id number].  Admission date:  07/12/2015  Admitting Physician  Ihor Austin, MD  Discharge Date:  07/13/2015   Primary MD  No PCP Per Patient [facility] Recommendations for primary care physician for things to follow:  Follow up with [facility] nephrology for routine HD needs  Follow up PCP IN one week F/u Surgery  (new Appointment) for hemorrohoids Admission Diagnosis  Hyperkalemia [E87.5] Diarrhea, unspecified type [R19.7]   Discharge Diagnosis  Hyperkalemia [E87.5] Diarrhea, unspecified type [R19.7]   Principal Problem:   Hyperkalemia      Past Medical History  Diagnosis Date  . ESRD on dialysis (HCC)   . Diabetes 1.5, managed as type 2 (HCC)   . Hyperlipidemia   . Neuropathy [facility])     Past Surgical History  Procedure Laterality Date  . Leg surgery    . Av fistula placement         History of present illness and  Hospital Course:     Kindly see H&P for history of present illness and admission details, please review complete Labs, Consult reports and Test reports for all details in brief  HPI  from the history and physical done on the day of admission  45 yr old female with h/o ESRD on Tue,Thu,Sat admitted for diarrhea for  3 days.found to have severe hyperkalemia with  Potassium of 7.admitted to ICU  Hospital Course   1.Severe Hyperkalemia ;EKG showed wide QRS  With LBB,no  Previous EKG. Because of EKG changes and severe hyperkalemia,pt received calcium gluconate,insulin,dextrose,nephrology is consulted for emergency HD.seen by DR.Harmeet Singh,he arranged for  Urgent HD on 5/17.Potassium improved from 7.2 to   3.9.  2.Severe diarrhea;stool cdiff has been negative,her stool  Culture showed Enteropathogenic ecoli..started on cipro.her  diarrhea stopped.gave prescription for Cipro 500 mg po bid 7 days 3.hemorrhoids :not  External.has h/o internal hemorrhoids out any rectal bleed,but has constipation.told her to have high fibre diet,stool softers,have spoke to he rto have her pcp refer to surgeon.   4.ESRD on HD tue,Thu,Sat;her next HD is tomorrow Spoke with help  Of  Spanish translator and answered all her questions,   Discharge Condition:stable   Follow UP  Follow-up Information    Follow up with pcp In 1 week.        Discharge Instructions  and  Discharge Medications       Medication List    TAKE these medications        ciprofloxacin 500 MG tablet  Commonly known as:  CIPRO  Take 1 tablet (500 mg total) by mouth 2 (two) times daily.     docusate sodium 100 MG capsule  Commonly known as:  COLACE  Take 1 capsule (100 mg total) by mouth 2 (two) times daily.     hydrocortisone 2.5 % rectal cream  Commonly known as:  ANUSOL-HC  Place rectally 2 (two) times daily.     metoprolol succinate 25 MG 24 hr tablet  Commonly known as:  TOPROL XL  Take 1 tablet (25 mg total) by mouth daily.     mupirocin ointment 2 %  Commonly known as:  BACTROBAN  Place 1 application into the nose 2 (two) times daily.     witch hazel-glycerin pad  Commonly known as:  TUCKS  Apply topically as needed for itching.          Diet and Activity recommendation: See Discharge Instructions above  Consults obtained - nephro   Major procedures and Radiology Reports - PLEASE review detailed and final reports for all details, in brief -      No results found.  Micro Results    Recent Results (from the past 240 hour(s))  MRSA PCR Screening     Status: Abnormal   Collection Time: 07/12/15  4:58 AM  Result Value Ref Range Status   MRSA by PCR POSITIVE (A) NEGATIVE Final    Comment:        The GeneXpert MRSA Assay (FDA approved for NASAL specimens only), is one component of a comprehensive MRSA  colonization surveillance program. It is not intended to diagnose MRSA infection nor to guide or monitor treatment for MRSA infections. CALLED TO FELICIA PREUDHOMME @ 0623 ON 07/12/2015 BY CAF   Gastrointestinal Panel by PCR , Stool     Status: Abnormal   Collection Time: 07/12/15 10:02 AM  Result Value Ref Range Status   Campylobacter species NOT DETECTED NOT DETECTED Final   Plesimonas shigelloides NOT DETECTED NOT DETECTED Final   Salmonella species NOT DETECTED NOT DETECTED Final   Yersinia enterocolitica NOT DETECTED NOT DETECTED Final   Vibrio species NOT DETECTED NOT DETECTED Final   Vibrio cholerae NOT DETECTED NOT DETECTED Final   Enteroaggregative E coli (EAEC) NOT DETECTED NOT DETECTED Final   Enteropathogenic E coli (EPEC) DETECTED (A) NOT DETECTED Final    Comment: CRITICAL RESULT CALLED TO, READ BACK BY AND VERIFIED WITH: RN Lerry PatersonJEREMAIH KEENE 07/12/15 1250 MLM    Enterotoxigenic E coli (ETEC) NOT DETECTED NOT DETECTED Final   Shiga like toxin producing E coli (STEC) NOT DETECTED NOT DETECTED Final   E. coli O157 NOT DETECTED NOT DETECTED Final   Shigella/Enteroinvasive E coli (EIEC) NOT DETECTED NOT DETECTED Final   Cryptosporidium NOT DETECTED NOT DETECTED Final   Cyclospora cayetanensis NOT DETECTED NOT DETECTED Final   Entamoeba histolytica NOT DETECTED NOT DETECTED Final   Giardia lamblia NOT DETECTED NOT DETECTED Final   Adenovirus F40/41 NOT DETECTED NOT DETECTED Final   Astrovirus NOT DETECTED NOT DETECTED Final   Norovirus GI/GII NOT DETECTED NOT DETECTED Final   Rotavirus A NOT DETECTED NOT DETECTED Final   Sapovirus (I, II, IV, and V) NOT DETECTED NOT DETECTED Final  C difficile quick scan w PCR reflex     Status: None   Collection Time: 07/12/15 10:02 AM  Result Value Ref Range Status   C Diff antigen NEGATIVE NEGATIVE Final   C Diff toxin NEGATIVE NEGATIVE Final   C Diff interpretation Negative for C. difficile  Final       Today   Subjective:    Marilynn LatinoSonia Reyes Parga today has no headache,no chest abdominal pain,no new weakness tingling or numbness, feels much better wants to go home today.   Objective:   Blood pressure 167/86, pulse 83, temperature 97.8 F (36.6 C), temperature source Oral, resp. rate 19, height 5\' 4"  (1.626 m), weight 77.6 kg (171 lb 1.2 oz), SpO2 97 %.  No intake or output data in the 24 hours ending 07/13/15 1650  Exam Awake Alert, Oriented x 3, No new F.N deficits, Normal affect [place].AT,PERRAL Supple Neck,No JVD, No cervical lymphadenopathy appriciated.  Symmetrical Chest wall movement, Good air movement bilaterally, CTAB RRR,No Gallops,Rubs or new Murmurs, No Parasternal Heave +ve B.Sounds, Abd Soft, Non tender, No organomegaly appriciated, No rebound -guarding or rigidity. No Cyanosis, Clubbing or edema, No new Rash or bruise  Data Review   CBC w Diff:  Lab Results  Component Value Date   WBC 7.2 07/12/2015   WBC 8.3 09/11/2011   HGB 12.8 07/12/2015   HGB 12.4 09/11/2011   HCT 39.1 07/12/2015   HCT 38.1 09/11/2011   PLT 114* 07/12/2015   PLT 164 09/11/2011    CMP:  Lab Results  Component Value Date   NA 133* 07/12/2015   NA 130* 09/11/2011   K 3.9 07/12/2015   K 4.1 09/11/2011   CL 91* 07/12/2015   CL 91* 09/11/2011   CO2 27 07/12/2015   CO2 27 09/11/2011   BUN 33* 07/12/2015   BUN 28* 09/11/2011   CREATININE 4.24* 07/12/2015   CREATININE 3.50* 09/11/2011   PROT 7.0 07/12/2015   PROT 8.8* 09/11/2011   ALBUMIN 3.9 07/12/2015   ALBUMIN 4.4 09/11/2011   BILITOT 1.3* 07/12/2015   BILITOT 0.9 09/11/2011   ALKPHOS 113 07/12/2015   ALKPHOS 212* 09/11/2011   AST 20 07/12/2015   AST 33 09/11/2011   ALT 13* 07/12/2015   ALT 28 09/11/2011  .   Total Time in preparing paper work, data evaluation and todays exam - 35 minutes  Laisa Larrick M.D on 07/13/2015 at 4:50 PM    Note: This dictation was prepared with Dragon dictation along with smaller phrase technology. Any  transcriptional errors that result from this process are unintentional.

## 2015-07-13 NOTE — Progress Notes (Signed)
D/C telemetry and central tele notified.

## 2015-07-13 NOTE — Discharge Instructions (Signed)
High fibre diet

## 2015-12-16 ENCOUNTER — Emergency Department: Payer: Medicaid Other

## 2015-12-16 ENCOUNTER — Encounter: Payer: Self-pay | Admitting: Emergency Medicine

## 2015-12-16 ENCOUNTER — Inpatient Hospital Stay
Admission: EM | Admit: 2015-12-16 | Discharge: 2015-12-25 | DRG: 239 | Disposition: A | Discharge status: Home / Self Care | Payer: Medicaid Other | Attending: Internal Medicine | Admitting: Internal Medicine

## 2015-12-16 DIAGNOSIS — E875 Hyperkalemia: Secondary | ICD-10-CM | POA: Diagnosis not present

## 2015-12-16 DIAGNOSIS — M7989 Other specified soft tissue disorders: Secondary | ICD-10-CM

## 2015-12-16 DIAGNOSIS — Z833 Family history of diabetes mellitus: Secondary | ICD-10-CM

## 2015-12-16 DIAGNOSIS — L03116 Cellulitis of left lower limb: Secondary | ICD-10-CM | POA: Diagnosis present

## 2015-12-16 DIAGNOSIS — L03032 Cellulitis of left toe: Secondary | ICD-10-CM

## 2015-12-16 DIAGNOSIS — E1152 Type 2 diabetes mellitus with diabetic peripheral angiopathy with gangrene: Principal | ICD-10-CM | POA: Diagnosis present

## 2015-12-16 DIAGNOSIS — E1110 Type 2 diabetes mellitus with ketoacidosis without coma: Secondary | ICD-10-CM

## 2015-12-16 DIAGNOSIS — I82402 Acute embolism and thrombosis of unspecified deep veins of left lower extremity: Secondary | ICD-10-CM | POA: Diagnosis present

## 2015-12-16 DIAGNOSIS — Z794 Long term (current) use of insulin: Secondary | ICD-10-CM

## 2015-12-16 DIAGNOSIS — Z992 Dependence on renal dialysis: Secondary | ICD-10-CM

## 2015-12-16 DIAGNOSIS — I10 Essential (primary) hypertension: Secondary | ICD-10-CM | POA: Diagnosis present

## 2015-12-16 DIAGNOSIS — I120 Hypertensive chronic kidney disease with stage 5 chronic kidney disease or end stage renal disease: Secondary | ICD-10-CM | POA: Diagnosis present

## 2015-12-16 DIAGNOSIS — L97529 Non-pressure chronic ulcer of other part of left foot with unspecified severity: Secondary | ICD-10-CM | POA: Diagnosis present

## 2015-12-16 DIAGNOSIS — E1122 Type 2 diabetes mellitus with diabetic chronic kidney disease: Secondary | ICD-10-CM | POA: Diagnosis present

## 2015-12-16 DIAGNOSIS — E1169 Type 2 diabetes mellitus with other specified complication: Secondary | ICD-10-CM | POA: Diagnosis present

## 2015-12-16 DIAGNOSIS — L0390 Cellulitis, unspecified: Secondary | ICD-10-CM | POA: Diagnosis present

## 2015-12-16 DIAGNOSIS — E119 Type 2 diabetes mellitus without complications: Secondary | ICD-10-CM

## 2015-12-16 DIAGNOSIS — D631 Anemia in chronic kidney disease: Secondary | ICD-10-CM | POA: Diagnosis present

## 2015-12-16 DIAGNOSIS — E11622 Type 2 diabetes mellitus with other skin ulcer: Secondary | ICD-10-CM | POA: Diagnosis present

## 2015-12-16 DIAGNOSIS — I96 Gangrene, not elsewhere classified: Secondary | ICD-10-CM

## 2015-12-16 DIAGNOSIS — E1165 Type 2 diabetes mellitus with hyperglycemia: Secondary | ICD-10-CM | POA: Diagnosis present

## 2015-12-16 DIAGNOSIS — I998 Other disorder of circulatory system: Secondary | ICD-10-CM

## 2015-12-16 DIAGNOSIS — E1142 Type 2 diabetes mellitus with diabetic polyneuropathy: Secondary | ICD-10-CM | POA: Diagnosis present

## 2015-12-16 DIAGNOSIS — N186 End stage renal disease: Secondary | ICD-10-CM | POA: Diagnosis present

## 2015-12-16 DIAGNOSIS — R262 Difficulty in walking, not elsewhere classified: Secondary | ICD-10-CM

## 2015-12-16 DIAGNOSIS — I82432 Acute embolism and thrombosis of left popliteal vein: Secondary | ICD-10-CM

## 2015-12-16 DIAGNOSIS — L97919 Non-pressure chronic ulcer of unspecified part of right lower leg with unspecified severity: Secondary | ICD-10-CM | POA: Diagnosis present

## 2015-12-16 DIAGNOSIS — E785 Hyperlipidemia, unspecified: Secondary | ICD-10-CM | POA: Diagnosis present

## 2015-12-16 DIAGNOSIS — N2581 Secondary hyperparathyroidism of renal origin: Secondary | ICD-10-CM | POA: Diagnosis present

## 2015-12-16 DIAGNOSIS — M869 Osteomyelitis, unspecified: Secondary | ICD-10-CM | POA: Diagnosis present

## 2015-12-16 HISTORY — DX: Disorder of kidney and ureter, unspecified: N28.9

## 2015-12-16 LAB — CBC
HCT: 39.7 % (ref 35.0–47.0)
Hemoglobin: 12.9 g/dL (ref 12.0–16.0)
MCH: 28.4 pg (ref 26.0–34.0)
MCHC: 32.5 g/dL (ref 32.0–36.0)
MCV: 87.2 fL (ref 80.0–100.0)
Platelets: 196 10*3/uL (ref 150–440)
RBC: 4.55 MIL/uL (ref 3.80–5.20)
RDW: 14.7 % — ABNORMAL HIGH (ref 11.5–14.5)
WBC: 12.3 10*3/uL — ABNORMAL HIGH (ref 3.6–11.0)

## 2015-12-16 LAB — HEPATIC FUNCTION PANEL
ALT: 10 U/L — AB (ref 14–54)
AST: 16 U/L (ref 15–41)
Albumin: 3.6 g/dL (ref 3.5–5.0)
Alkaline Phosphatase: 139 U/L — ABNORMAL HIGH (ref 38–126)
BILIRUBIN DIRECT: 0.2 mg/dL (ref 0.1–0.5)
BILIRUBIN INDIRECT: 0.6 mg/dL (ref 0.3–0.9)
BILIRUBIN TOTAL: 0.8 mg/dL (ref 0.3–1.2)
Total Protein: 8.1 g/dL (ref 6.5–8.1)

## 2015-12-16 LAB — DIFFERENTIAL
BASOS ABS: 0.1 10*3/uL (ref 0–0.1)
Basophils Relative: 1 %
EOS ABS: 0.1 10*3/uL (ref 0–0.7)
EOS PCT: 1 %
Lymphocytes Relative: 9 %
Lymphs Abs: 1.1 10*3/uL (ref 1.0–3.6)
MONOS PCT: 8 %
Monocytes Absolute: 1 10*3/uL — ABNORMAL HIGH (ref 0.2–0.9)
NEUTROS ABS: 10 10*3/uL — AB (ref 1.4–6.5)
Neutrophils Relative %: 81 %

## 2015-12-16 LAB — BASIC METABOLIC PANEL
Anion gap: 13 (ref 5–15)
BUN: 38 mg/dL — ABNORMAL HIGH (ref 6–20)
CO2: 26 mmol/L (ref 22–32)
Calcium: 9.8 mg/dL (ref 8.9–10.3)
Chloride: 92 mmol/L — ABNORMAL LOW (ref 101–111)
Creatinine, Ser: 6.05 mg/dL — ABNORMAL HIGH (ref 0.44–1.00)
GFR calc Af Amer: 9 mL/min — ABNORMAL LOW (ref >60)
GFR calc non Af Amer: 7 mL/min — ABNORMAL LOW (ref >60)
Glucose, Bld: 248 mg/dL — ABNORMAL HIGH (ref 65–99)
Potassium: 4.7 mmol/L (ref 3.5–5.1)
Sodium: 131 mmol/L — ABNORMAL LOW (ref 135–145)

## 2015-12-16 LAB — LACTIC ACID, PLASMA: Lactic Acid, Venous: 1.1 mmol/L (ref 0.5–1.9)

## 2015-12-16 MED ORDER — FENTANYL CITRATE (PF) 100 MCG/2ML IJ SOLN
50.0000 ug | INTRAMUSCULAR | Status: DC | PRN
  Administered 2015-12-16 – 2015-12-17 (×3): 50 ug via INTRAVENOUS
  Filled 2015-12-16 (×2): qty 2

## 2015-12-16 MED ORDER — IOPAMIDOL (ISOVUE-300) INJECTION 61%
100.0000 mL | Freq: Once | INTRAVENOUS | Status: AC | PRN
  Administered 2015-12-16: 100 mL via INTRAVENOUS

## 2015-12-16 MED ORDER — SODIUM CHLORIDE 0.9 % IV SOLN
3.0000 g | Freq: Once | INTRAVENOUS | Status: AC
  Administered 2015-12-16: 3 g via INTRAVENOUS
  Filled 2015-12-16: qty 3

## 2015-12-16 MED ORDER — FENTANYL CITRATE (PF) 100 MCG/2ML IJ SOLN
INTRAMUSCULAR | Status: AC
  Administered 2015-12-17: 50 ug via INTRAVENOUS
  Filled 2015-12-16: qty 2

## 2015-12-16 NOTE — ED Provider Notes (Signed)
[facility] Emergency Department Provider Note    First MD Initiated Contact with Patient 12/16/15 2214     (approximate)  I have reviewed the triage vital signs and the nursing notes.   HISTORY  Chief Complaint Leg Pain and Foot Pain    HPI [name] is a 45 y.o. female  with diabetes peripheral neuropathy and end-stage renal disease on dialysis presents with complaint of left leg pain and foot pain. Patient states that she's been developed multiple areas of blisters encrustations on the left leg for the past month and half. States that roughly a month and half ago or so started having worsening foot pain and area of redness. The past several days states that she's had acute worsening of left posterior medial ankle pain as well as progressively disc ration of the great toe and areas in between her foot. She denies any nausea or vomiting. She gets dialysis Tuesday Thursday Saturday. Has not been on any antibiotics.   Past Medical History:  Diagnosis Date  . Diabetes 1.5, managed as type 2 (HCC)   . ESRD on dialysis (HCC)   . Hyperlipidemia   . Hypertension   . Neuropathy (HCC)   . Renal insufficiency     Patient Active Problem List   Diagnosis Date Noted  . Hyperkalemia 07/12/2015    Past Surgical History:  Procedure Laterality Date  . AV FISTULA PLACEMENT    . LEG SURGERY      Prior to Admission medications   Medication Sig Start Date End Date Taking? Authorizing Provider  ciprofloxacin (CIPRO) 500 MG tablet Take 1 tablet (500 mg total) by mouth 2 (two) times daily. 07/13/15   Katha Hamming, MD  docusate sodium (COLACE) 100 MG capsule Take 1 capsule (100 mg total) by mouth 2 (two) times daily. 07/13/15   Katha Hamming, MD  hydrocortisone (ANUSOL-HC) 2.5 % rectal cream Place rectally 2 (two) times daily. 07/13/15   Katha Hamming, MD  metoprolol succinate (TOPROL XL) 25 MG 24 hr tablet Take 1 tablet (25 mg total) by mouth  daily. 07/13/15   Katha Hamming, MD  mupirocin ointment (BACTROBAN) 2 % Place 1 application into the nose 2 (two) times daily. 07/13/15   Katha Hamming, MD  witch hazel-glycerin (TUCKS) pad Apply topically as needed for itching. 07/13/15   Katha Hamming, MD    Allergies Review of patient's allergies indicates no known allergies.  Family History  Problem Relation Age of Onset  . Diabetes Mellitus II Father     Social History Social History  Substance Use Topics  . Smoking status: Never Smoker  . Smokeless tobacco: Never Used  . Alcohol use No    Review of Systems Patient denies headaches, rhinorrhea, blurry vision, numbness, shortness of breath, chest pain, edema, cough, abdominal pain, nausea, vomiting, diarrhea, dysuria, fevers, rashes or hallucinations unless otherwise stated above in HPI. ____________________________________________   PHYSICAL EXAM:  VITAL SIGNS: Vitals:   12/16/15 2221 12/16/15 2223  BP: (!) 168/91   Pulse:  96    Constitutional: Alert and oriented.  in no acute distress. Eyes: PERRL. EOMI. Head: Atraumatic. Nose: No congestion/rhinnorhea. Mouth/Throat: Mucous membranes are moist.  Oropharynx non-erythematous. Neck: No stridor. Painless ROM. No cervical spine tenderness to palpation Hematological/Lymphatic/Immunilogical: No cervical lymphadenopathy. Cardiovascular: Normal rate, regular rhythm. Grossly normal heart sounds.  Good peripheral circulation. Respiratory: Normal respiratory effort.  No retractions. Lungs CTAB. Gastrointestinal: Soft and nontender. No distention. No abdominal bruits. No CVA tenderness. Genitourinary:  Musculoskeletal:lle  swelling with renal disease papules of calf, weak distal pulses.  necrotic and gangrenous appearing great toe as well as 3rd and 4th toes. Neurologic:  Normal speech and language. No gross focal neurologic deficits are appreciated. No gait instability. Skin:  Skin is warm, dry and intact. No  rash noted. Psychiatric: Mood and affect are normal. Speech and behavior are normal.  ____________________________________________   LABS (all labs ordered are listed, but only abnormal results are displayed)  Results for orders placed or performed during the hospital encounter of 12/16/15 (from the past 24 hour(s))  Basic metabolic panel     Status: Abnormal   Collection Time: 12/16/15  7:00 PM  Result Value Ref Range   Sodium 131 (L) 135 - 145 mmol/L   Potassium 4.7 3.5 - 5.1 mmol/L   Chloride 92 (L) 101 - 111 mmol/L   CO2 26 22 - 32 mmol/L   Glucose, Bld 248 (H) 65 - 99 mg/dL   BUN 38 (H) 6 - 20 mg/dL   Creatinine, Ser 1.616.05 (H) 0.44 - 1.00 mg/dL   Calcium 9.8 8.9 - 09.610.3 mg/dL   GFR calc non Af Amer 7 (L) >60 mL/min   GFR calc Af Amer 9 (L) >60 mL/min   Anion gap 13 5 - 15  CBC     Status: Abnormal   Collection Time: 12/16/15  7:00 PM  Result Value Ref Range   WBC 12.3 (H) 3.6 - 11.0 K/uL   RBC 4.55 3.80 - 5.20 MIL/uL   Hemoglobin 12.9 12.0 - 16.0 g/dL   HCT 04.539.7 40.935.0 - 81.147.0 %   MCV 87.2 80.0 - 100.0 fL   MCH 28.4 26.0 - 34.0 pg   MCHC 32.5 32.0 - 36.0 g/dL   RDW 91.414.7 (H) 78.211.5 - 95.614.5 %   Platelets 196 150 - 440 K/uL   ____________________________________________  EKG_______________________________  RADIOLOGY  I personally reviewed all radiographic images ordered to evaluate for the above acute complaints and reviewed radiology reports and findings.  These findings were personally discussed with the patient.  Please see medical record for radiology report.  ____________________________________________   PROCEDURES  Procedure(s) performed: none    Critical Care performed:  ____________________________________________   INITIAL IMPRESSION / ASSESSMENT AND PLAN / ED COURSE  Pertinent labs & imaging results that were available during my care of the patient were reviewed by me and considered in my medical decision making (see chart for details).  DDX: limb  ischemia, dvt, cellulitis, abscess, nec fasc  [name] is a 89 y.o. who presents to the ED with several weeks of worsening left lower extremity swelling and pain. Patient is safe febrile. Left lower extremity is very concerning appearing. Diffusely swollen with areas of what appears to be necrotic and gangrenous tissue. Ultrasound shows evidence of probable popliteal DVT. Based on already necrotic tissue this does not appear to be acutely ischemic limb but she does have areas of pallor showing some component of peripheral vascular disease.  Clinical Course  Comment By Time  Spoke with Dr. Betsy Priessneir is recommending IV heparin.  Patient her hyperglycemia and being on dialysis to feel patient will require admission for IV antibiotics as well as IV heparin drip while being evaluated by vascular surgery for possible intervention in the morning. Have discussed with the patient and available family all diagnostics and treatments performed thus far and all questions were answered to the best of my ability. The patient demonstrates understanding and agreement with plan.  Willy EddyPatrick Maksymilian Mabey, MD  10/20 2328     ____________________________________________   FINAL CLINICAL IMPRESSION(S) / ED DIAGNOSES  Final diagnoses:  Leg swelling  Lower limb ischemia  Cellulitis of toe of left foot  Deep vein thrombosis (DVT) of popliteal vein of left lower extremity, unspecified chronicity (HCC)      NEW MEDICATIONS STARTED DURING THIS VISIT:  New Prescriptions   No medications on file     Note:  This document was prepared using Dragon voice recognition software and may include unintentional dictation errors.    Willy Eddy, MD 12/16/15 [phone]

## 2015-12-16 NOTE — ED Notes (Signed)
Patient transported to CT 

## 2015-12-16 NOTE — ED Notes (Signed)
MD at bedside with this RN and interpretor

## 2015-12-16 NOTE — ED Triage Notes (Signed)
Pt presents to ED with c/o left leg pain and foot pain. Pt has multiple areas of blisters on left leg that pt's family states are related to her ESRD. Pt is on HD (TTS) and did go yesterday. Pt has been [facility] for similar problem.  Swelling noted to left leg and foot with some warmth.  Family also advised that the patient has black around big toe on the left foot and inbetween her foot.  Pain is worse with ambulation. Pain started yesterday per patient.  Assessment provided via Spanish interpreter.

## 2015-12-17 ENCOUNTER — Inpatient Hospital Stay: Payer: Medicaid Other

## 2015-12-17 DIAGNOSIS — E785 Hyperlipidemia, unspecified: Secondary | ICD-10-CM | POA: Diagnosis present

## 2015-12-17 DIAGNOSIS — N186 End stage renal disease: Secondary | ICD-10-CM

## 2015-12-17 DIAGNOSIS — I10 Essential (primary) hypertension: Secondary | ICD-10-CM | POA: Diagnosis present

## 2015-12-17 DIAGNOSIS — Z992 Dependence on renal dialysis: Secondary | ICD-10-CM | POA: Diagnosis not present

## 2015-12-17 DIAGNOSIS — Z833 Family history of diabetes mellitus: Secondary | ICD-10-CM | POA: Diagnosis not present

## 2015-12-17 DIAGNOSIS — E1165 Type 2 diabetes mellitus with hyperglycemia: Secondary | ICD-10-CM | POA: Diagnosis present

## 2015-12-17 DIAGNOSIS — E11622 Type 2 diabetes mellitus with other skin ulcer: Secondary | ICD-10-CM | POA: Diagnosis present

## 2015-12-17 DIAGNOSIS — N2581 Secondary hyperparathyroidism of renal origin: Secondary | ICD-10-CM | POA: Diagnosis present

## 2015-12-17 DIAGNOSIS — E1169 Type 2 diabetes mellitus with other specified complication: Secondary | ICD-10-CM | POA: Diagnosis present

## 2015-12-17 DIAGNOSIS — L97919 Non-pressure chronic ulcer of unspecified part of right lower leg with unspecified severity: Secondary | ICD-10-CM | POA: Diagnosis present

## 2015-12-17 DIAGNOSIS — L97529 Non-pressure chronic ulcer of other part of left foot with unspecified severity: Secondary | ICD-10-CM | POA: Diagnosis present

## 2015-12-17 DIAGNOSIS — Z794 Long term (current) use of insulin: Secondary | ICD-10-CM | POA: Diagnosis not present

## 2015-12-17 DIAGNOSIS — M869 Osteomyelitis, unspecified: Secondary | ICD-10-CM | POA: Diagnosis present

## 2015-12-17 DIAGNOSIS — I82402 Acute embolism and thrombosis of unspecified deep veins of left lower extremity: Secondary | ICD-10-CM | POA: Diagnosis present

## 2015-12-17 DIAGNOSIS — L0390 Cellulitis, unspecified: Secondary | ICD-10-CM | POA: Diagnosis present

## 2015-12-17 DIAGNOSIS — E1152 Type 2 diabetes mellitus with diabetic peripheral angiopathy with gangrene: Secondary | ICD-10-CM | POA: Diagnosis present

## 2015-12-17 DIAGNOSIS — E119 Type 2 diabetes mellitus without complications: Secondary | ICD-10-CM

## 2015-12-17 DIAGNOSIS — I120 Hypertensive chronic kidney disease with stage 5 chronic kidney disease or end stage renal disease: Secondary | ICD-10-CM | POA: Diagnosis present

## 2015-12-17 DIAGNOSIS — D631 Anemia in chronic kidney disease: Secondary | ICD-10-CM | POA: Diagnosis present

## 2015-12-17 DIAGNOSIS — M79605 Pain in left leg: Secondary | ICD-10-CM

## 2015-12-17 DIAGNOSIS — I998 Other disorder of circulatory system: Secondary | ICD-10-CM | POA: Diagnosis present

## 2015-12-17 DIAGNOSIS — L03116 Cellulitis of left lower limb: Secondary | ICD-10-CM | POA: Diagnosis present

## 2015-12-17 DIAGNOSIS — L03032 Cellulitis of left toe: Secondary | ICD-10-CM | POA: Diagnosis present

## 2015-12-17 DIAGNOSIS — E875 Hyperkalemia: Secondary | ICD-10-CM | POA: Diagnosis not present

## 2015-12-17 DIAGNOSIS — M7989 Other specified soft tissue disorders: Secondary | ICD-10-CM | POA: Diagnosis present

## 2015-12-17 DIAGNOSIS — E1142 Type 2 diabetes mellitus with diabetic polyneuropathy: Secondary | ICD-10-CM | POA: Diagnosis present

## 2015-12-17 DIAGNOSIS — I82432 Acute embolism and thrombosis of left popliteal vein: Secondary | ICD-10-CM | POA: Diagnosis present

## 2015-12-17 DIAGNOSIS — E1122 Type 2 diabetes mellitus with diabetic chronic kidney disease: Secondary | ICD-10-CM | POA: Diagnosis present

## 2015-12-17 LAB — BASIC METABOLIC PANEL
Anion gap: 13 (ref 5–15)
BUN: 42 mg/dL — AB (ref 6–20)
CO2: 23 mmol/L (ref 22–32)
Calcium: 9.3 mg/dL (ref 8.9–10.3)
Chloride: 98 mmol/L — ABNORMAL LOW (ref 101–111)
Creatinine, Ser: 6.39 mg/dL — ABNORMAL HIGH (ref 0.44–1.00)
GFR calc Af Amer: 8 mL/min — ABNORMAL LOW (ref >60)
GFR, EST NON AFRICAN AMERICAN: 7 mL/min — AB (ref >60)
GLUCOSE: 149 mg/dL — AB (ref 65–99)
POTASSIUM: 4.4 mmol/L (ref 3.5–5.1)
Sodium: 134 mmol/L — ABNORMAL LOW (ref 135–145)

## 2015-12-17 LAB — RENAL FUNCTION PANEL
Albumin: 3.6 g/dL (ref 3.5–5.0)
Anion gap: 15 (ref 5–15)
BUN: 42 mg/dL — AB (ref 6–20)
CALCIUM: 9.6 mg/dL (ref 8.9–10.3)
CHLORIDE: 91 mmol/L — AB (ref 101–111)
CO2: 23 mmol/L (ref 22–32)
CREATININE: 6.83 mg/dL — AB (ref 0.44–1.00)
GFR, EST AFRICAN AMERICAN: 8 mL/min — AB (ref >60)
GFR, EST NON AFRICAN AMERICAN: 6 mL/min — AB (ref >60)
Glucose, Bld: 155 mg/dL — ABNORMAL HIGH (ref 65–99)
Phosphorus: 5.7 mg/dL — ABNORMAL HIGH (ref 2.5–4.6)
Potassium: 4.4 mmol/L (ref 3.5–5.1)
SODIUM: 129 mmol/L — AB (ref 135–145)

## 2015-12-17 LAB — CBC
HCT: 39.1 % (ref 35.0–47.0)
HEMATOCRIT: 34.2 % — AB (ref 35.0–47.0)
Hemoglobin: 11.8 g/dL — ABNORMAL LOW (ref 12.0–16.0)
Hemoglobin: 13.3 g/dL (ref 12.0–16.0)
MCH: 29.6 pg (ref 26.0–34.0)
MCH: 29.4 pg (ref 26.0–34.0)
MCHC: 34.6 g/dL (ref 32.0–36.0)
MCHC: 33.9 g/dL (ref 32.0–36.0)
MCV: 85.6 fL (ref 80.0–100.0)
MCV: 86.7 fL (ref 80.0–100.0)
PLATELETS: 190 10*3/uL (ref 150–440)
Platelets: 173 10*3/uL (ref 150–440)
RBC: 4 MIL/uL (ref 3.80–5.20)
RBC: 4.51 MIL/uL (ref 3.80–5.20)
RDW: 14.6 % — AB (ref 11.5–14.5)
RDW: 14.9 % — AB (ref 11.5–14.5)
WBC: 9.5 10*3/uL (ref 3.6–11.0)
WBC: 10 10*3/uL (ref 3.6–11.0)

## 2015-12-17 LAB — PROTIME-INR
INR: 1.24
PROTHROMBIN TIME: 15.7 s — AB (ref 11.4–15.2)

## 2015-12-17 LAB — GLUCOSE, CAPILLARY
Glucose-Capillary: 254 mg/dL — ABNORMAL HIGH (ref 65–99)
Glucose-Capillary: 184 mg/dL — ABNORMAL HIGH (ref 65–99)
Glucose-Capillary: 289 mg/dL — ABNORMAL HIGH (ref 65–99)

## 2015-12-17 LAB — RAPID HIV SCREEN (HIV 1/2 AB+AG)
HIV 1/2 Antibodies: NONREACTIVE
HIV-1 P24 ANTIGEN - HIV24: NONREACTIVE

## 2015-12-17 LAB — APTT: APTT: 37 s — AB (ref 24–36)

## 2015-12-17 LAB — HEPARIN LEVEL (UNFRACTIONATED)
HEPARIN UNFRACTIONATED: 0.14 [IU]/mL — AB (ref 0.30–0.70)
HEPARIN UNFRACTIONATED: 0.24 [IU]/mL — AB (ref 0.30–0.70)

## 2015-12-17 LAB — MRSA PCR SCREENING: MRSA BY PCR: NEGATIVE

## 2015-12-17 MED ORDER — HEPARIN SODIUM (PORCINE) 1000 UNIT/ML DIALYSIS
1000.0000 [IU] | INTRAMUSCULAR | Status: DC | PRN
  Filled 2015-12-17 (×2): qty 1

## 2015-12-17 MED ORDER — ONDANSETRON HCL 4 MG/2ML IJ SOLN
4.0000 mg | Freq: Four times a day (QID) | INTRAMUSCULAR | Status: DC | PRN
Start: 2015-12-17 — End: 2015-12-25

## 2015-12-17 MED ORDER — VANCOMYCIN HCL IN DEXTROSE 750-5 MG/150ML-% IV SOLN
750.0000 mg | INTRAVENOUS | Status: DC
  Administered 2015-12-17 – 2015-12-22 (×3): 750 mg via INTRAVENOUS
  Filled 2015-12-17 (×6): qty 150

## 2015-12-17 MED ORDER — ONDANSETRON HCL 4 MG PO TABS: 4.0000 mg | ORAL_TABLET | Freq: Four times a day (QID) | ORAL | Status: DC | PRN

## 2015-12-17 MED ORDER — LIDOCAINE-PRILOCAINE 2.5-2.5 % EX CREA
1.0000 "application " | TOPICAL_CREAM | CUTANEOUS | Status: DC | PRN
  Filled 2015-12-17: qty 5

## 2015-12-17 MED ORDER — ACETAMINOPHEN 325 MG PO TABS: 650.0000 mg | ORAL_TABLET | Freq: Four times a day (QID) | ORAL | Status: DC | PRN

## 2015-12-17 MED ORDER — OXYCODONE HCL 5 MG PO TABS
5.0000 mg | ORAL_TABLET | ORAL | Status: DC | PRN
  Administered 2015-12-17 – 2015-12-23 (×13): 5 mg via ORAL
  Filled 2015-12-17 (×14): qty 1

## 2015-12-17 MED ORDER — HEPARIN BOLUS VIA INFUSION
4500.0000 [IU] | Freq: Once | INTRAVENOUS | Status: AC
  Administered 2015-12-17: 4500 [IU] via INTRAVENOUS
  Filled 2015-12-17: qty 4500

## 2015-12-17 MED ORDER — INSULIN ASPART 100 UNIT/ML ~~LOC~~ SOLN
0.0000 [IU] | Freq: Four times a day (QID) | SUBCUTANEOUS | Status: DC
  Administered 2015-12-17: 2 [IU] via SUBCUTANEOUS
  Administered 2015-12-17: 5 [IU] via SUBCUTANEOUS
  Filled 2015-12-17: qty 5
  Filled 2015-12-17: qty 2

## 2015-12-17 MED ORDER — SODIUM CHLORIDE 0.9% FLUSH
3.0000 mL | Freq: Two times a day (BID) | INTRAVENOUS | Status: DC
  Administered 2015-12-18 – 2015-12-24 (×7): 3 mL via INTRAVENOUS

## 2015-12-17 MED ORDER — HEPARIN (PORCINE) IN NACL 100-0.45 UNIT/ML-% IJ SOLN
1750.0000 [IU]/h | INTRAMUSCULAR | Status: DC
  Administered 2015-12-17: 1100 [IU]/h via INTRAVENOUS
  Administered 2015-12-17: 1350 [IU]/h via INTRAVENOUS
  Administered 2015-12-19 – 2015-12-20 (×3): 1650 [IU]/h via INTRAVENOUS
  Administered 2015-12-21 – 2015-12-23 (×3): 1750 [IU]/h via INTRAVENOUS
  Filled 2015-12-17 (×18): qty 250

## 2015-12-17 MED ORDER — ACETAMINOPHEN 650 MG RE SUPP: 650.0000 mg | Freq: Four times a day (QID) | RECTAL | Status: DC | PRN

## 2015-12-17 MED ORDER — PIPERACILLIN-TAZOBACTAM 3.375 G IVPB
3.3750 g | Freq: Two times a day (BID) | INTRAVENOUS | Status: DC
  Administered 2015-12-17 – 2015-12-23 (×13): 3.375 g via INTRAVENOUS
  Filled 2015-12-17 (×13): qty 50

## 2015-12-17 MED ORDER — VANCOMYCIN HCL 10 G IV SOLR
1500.0000 mg | Freq: Once | INTRAVENOUS | Status: AC
  Administered 2015-12-17: 1500 mg via INTRAVENOUS
  Filled 2015-12-17: qty 1500

## 2015-12-17 MED ORDER — HEPARIN BOLUS VIA INFUSION
2200.0000 [IU] | Freq: Once | INTRAVENOUS | Status: AC
  Administered 2015-12-17: 2200 [IU] via INTRAVENOUS
  Filled 2015-12-17: qty 2200

## 2015-12-17 MED ORDER — SODIUM CHLORIDE 0.9 % IV SOLN: INTRAVENOUS | Status: DC

## 2015-12-17 MED ORDER — INSULIN ASPART 100 UNIT/ML ~~LOC~~ SOLN
0.0000 [IU] | Freq: Three times a day (TID) | SUBCUTANEOUS | Status: DC
  Administered 2015-12-17: 5 [IU] via SUBCUTANEOUS
  Administered 2015-12-18 (×2): 2 [IU] via SUBCUTANEOUS
  Administered 2015-12-18: 1 [IU] via SUBCUTANEOUS
  Administered 2015-12-18: 5 [IU] via SUBCUTANEOUS
  Administered 2015-12-19 (×4): 3 [IU] via SUBCUTANEOUS
  Administered 2015-12-20: 1 [IU] via SUBCUTANEOUS
  Administered 2015-12-21: 2 [IU] via SUBCUTANEOUS
  Administered 2015-12-21: 1 [IU] via SUBCUTANEOUS
  Administered 2015-12-21: 2 [IU] via SUBCUTANEOUS
  Administered 2015-12-22 (×2): 1 [IU] via SUBCUTANEOUS
  Administered 2015-12-23 (×2): 2 [IU] via SUBCUTANEOUS
  Administered 2015-12-23: 3 [IU] via SUBCUTANEOUS
  Administered 2015-12-24: 2 [IU] via SUBCUTANEOUS
  Administered 2015-12-24: 1 [IU] via SUBCUTANEOUS
  Filled 2015-12-17 (×2): qty 2
  Filled 2015-12-17: qty 3
  Filled 2015-12-17: qty 2
  Filled 2015-12-17: qty 3
  Filled 2015-12-17: qty 5
  Filled 2015-12-17 (×2): qty 2
  Filled 2015-12-17: qty 3
  Filled 2015-12-17 (×2): qty 1
  Filled 2015-12-17: qty 5
  Filled 2015-12-17 (×2): qty 1
  Filled 2015-12-17: qty 3
  Filled 2015-12-17 (×2): qty 2
  Filled 2015-12-17: qty 1
  Filled 2015-12-17: qty 2
  Filled 2015-12-17: qty 1

## 2015-12-17 NOTE — Progress Notes (Signed)
Shift assessment completed. Pt is lying in bed, alert and oriented, son at bedside to translate. Pt acknowledges pain to her l leg. Pt is on room air, lungs clear bilat, S1S2 heard, Abdomen is soft, bs hypoactive. Pink sleeve intact to l arm with dialysis fistula noted to LUA. PIV #18 intact to R fa, #20 also intact to R fa, sites are free of redness and swelling, heparin drip infusing at 1100 units/hr. Pt's L leg is edematous with multiple blisters noted to shin, bottom of foot is slightly rounded, pp very faint with cap refill > 3 seconds. RLL has pp intact, no pitting edema noted.

## 2015-12-17 NOTE — H&P (Signed)
[facility] [place] [facility]   PATIENT NAME: [name]    MR#:  [id number]  DATE OF BIRTH:  1970/08/27  DATE OF ADMISSION:  12/16/2015  PRIMARY CARE PHYSICIAN: No PCP Per Patient   REQUESTING/REFERRING PHYSICIAN: Roxan Hockey, MD  CHIEF COMPLAINT:   Chief Complaint  Patient presents with  . Leg Pain  . Foot Pain    HISTORY OF PRESENT ILLNESS:  [name]  is a 45 y.o. female who presents with Left lower extremity pain and swelling. Patient states that her symptoms began initially with swelling couple of weeks ago. Since that time it is worse and her pain began, and she then she started to notice discoloration in her toes. She came to the ED tonight for evaluation and was found to have some noticeable ischemia on her great toe and fourth toe of her left foot. Ultrasound showed occlusive DVT in that leg as well. She was also felt to possibly have some cellulitis in the foot. Vascular surgery was contacted from the ED and recommended admission with anticoagulation with a heparin drip and they will see her tomorrow. Hospitalists were called for admission. Of note, patient is Spanish-speaking only.  PAST MEDICAL HISTORY:   Past Medical History:  Diagnosis Date  . Diabetes 1.5, managed as type 2 (HCC)   . ESRD on dialysis (HCC)   . Hyperlipidemia   . Hypertension   . Neuropathy (HCC)   . Renal insufficiency     PAST SURGICAL HISTORY:   Past Surgical History:  Procedure Laterality Date  . AV FISTULA PLACEMENT    . LEG SURGERY      SOCIAL HISTORY:   Social History  Substance Use Topics  . Smoking status: Never Smoker  . Smokeless tobacco: Never Used  . Alcohol use No    FAMILY HISTORY:   Family History  Problem Relation Age of Onset  . Diabetes Mellitus II Father     DRUG ALLERGIES:  No Known Allergies  MEDICATIONS AT HOME:   Prior to Admission medications   Not on File    REVIEW OF SYSTEMS:  Review of Systems   Constitutional: Negative for chills, fever, malaise/fatigue and weight loss.  HENT: Negative for ear pain, hearing loss and tinnitus.   Eyes: Negative for blurred vision, double vision, pain and redness.  Respiratory: Negative for cough, hemoptysis and shortness of breath.   Cardiovascular: Positive for leg swelling (left leg). Negative for chest pain, palpitations and orthopnea.  Gastrointestinal: Negative for abdominal pain, constipation, diarrhea, nausea and vomiting.  Genitourinary: Negative for dysuria, frequency and hematuria.  Musculoskeletal: Negative for back pain, joint pain and neck pain.  Skin:       Ischemic color change distal left great toe and left fourth toe with some skin breakdown in her inter digital region of her fourth toe  Neurological: Negative for dizziness, tremors, focal weakness and weakness.  Endo/Heme/Allergies: Negative for polydipsia. Does not bruise/bleed easily.  Psychiatric/Behavioral: Negative for depression. The patient is not nervous/anxious and does not have insomnia.      VITAL SIGNS:   Vitals:   12/16/15 2223 12/16/15 2245 12/16/15 2300 12/16/15 2320  BP:  103/82 (!) 112/54 (!) 112/54  Pulse: 96 99 95 94  Resp:    16  SpO2: 100% 98% 96% 96%  Weight:      Height:       Wt Readings from Last 3 Encounters:  12/16/15 79 kg (174 lb 2.6 oz)  07/12/15  77.6 kg (171 lb 1.2 oz)    PHYSICAL EXAMINATION:  Physical Exam  Vitals reviewed. Constitutional: She is oriented to person, place, and time. She appears well-developed and well-nourished. No distress.  HENT:  Head: Normocephalic and atraumatic.  Mouth/Throat: Oropharynx is clear and moist.  Eyes: Conjunctivae and EOM are normal. Pupils are equal, round, and reactive to light. No scleral icterus.  Neck: Normal range of motion. Neck supple. No JVD present. No thyromegaly present.  Cardiovascular: Normal rate, regular rhythm and intact distal pulses.  Exam reveals no gallop and no friction rub.    No murmur heard. Respiratory: Effort normal and breath sounds normal. No respiratory distress. She has no wheezes. She has no rales.  GI: Soft. Bowel sounds are normal. She exhibits no distension. There is no tenderness.  Musculoskeletal: Normal range of motion. She exhibits edema (Left leg) and tenderness (Left lower extremity).  No arthritis, no gout  Lymphadenopathy:    She has no cervical adenopathy.  Neurological: She is alert and oriented to person, place, and time. No cranial nerve deficit.  No dysarthria, no aphasia  Skin: Skin is warm and dry. No rash noted. There is erythema (Left lower extremity, with ischemic skin changes distally).  Psychiatric: She has a normal mood and affect. Her behavior is normal. Judgment and thought content normal.    LABORATORY PANEL:   CBC  Recent Labs Lab 12/16/15 1900  WBC 12.3*  HGB 12.9  HCT 39.7  PLT 196   ------------------------------------------------------------------------------------------------------------------  Chemistries   Recent Labs Lab 12/16/15 1900  NA 131*  K 4.7  CL 92*  CO2 26  GLUCOSE 248*  BUN 38*  CREATININE 6.05*  CALCIUM 9.8  AST 16  ALT 10*  ALKPHOS 139*  BILITOT 0.8   ------------------------------------------------------------------------------------------------------------------  Cardiac Enzymes No results for input(s): TROPONINI in the last 168 hours. ------------------------------------------------------------------------------------------------------------------  RADIOLOGY:  US Venous Img Lower Unilateral Left  Result Date: 12/16/2015 CLINICAL DATA:  Acute onset of left leg swelling and pain. Initial encounter. EXAM: LEFT LOWER EXTREMITY VENOUS DOPPLER ULTRASOUND TECHNIQUE: Gray-scale sonography with graded compression, as well as color Doppler and duplex ultrasound were performed to evaluate the lower extremity deep venous systems from the level of the common femoral vein and including  the common femoral, femoral, profunda femoral, popliteal and calf veins including the posterior tibial, peroneal and gastrocnemius veins when visible. The superficial great saphenous vein was also interrogated. Spectral Doppler was utilized to evaluate flow at rest and with distal augmentation maneuvers in the common femoral, femoral and popliteal veins. COMPARISON:  None. FINDINGS: Contralateral Common Femoral Vein: Respiratory phasicity is normal and symmetric with the symptomatic side. No evidence of thrombus. Normal compressibility. Common Femoral Vein: No evidence of thrombus. Normal compressibility, respiratory phasicity and response to augmentation. Saphenofemoral Junction: No evidence of thrombus. Normal compressibility and flow on color Doppler imaging. Profunda Femoral Vein: No evidence of thrombus. Normal compressibility and flow on color Doppler imaging. Femoral Vein: No evidence of thrombus. Normal compressibility, respiratory phasicity and response to augmentation. Popliteal Vein: No evidence of thrombus. Normal compressibility, respiratory phasicity and response to augmentation. Calf Veins: Occlusive thrombus is noted within within one of the patient's two left peroneal veins. Superficial Great Saphenous Vein: No evidence of thrombus. Normal compressibility and flow on color Doppler imaging. Venous Reflux:  None. Other Findings:  A 1.4 cm node is noted at the left inguinal region. IMPRESSION: 1. Occlusive deep venous thrombus noted within one of the patient's left peroneal veins. 2. 1.4  cm left inguinal node noted. These results were called by telephone at the time of interpretation on 12/16/2015 at 9:49 pm to Dr. Roxan Hockeyobinson, who verbally acknowledged these results. Electronically Signed   By: Roanna RaiderJeffery  Chang M.D.   On: 12/16/2015 21:49   Ct Extrem Lower W Cm Bil  Result Date: 12/17/2015 CLINICAL DATA:  End-stage renal disease, diabetes and neuropathy. Patient complains of left leg and foot pain  with blistering seen of the left leg. Occlusive deep venous thrombosis within 1 of the patient's left peroneal veins on same day lower extremity ultrasound. EXAM: CT OF THE LOWER BILATERAL EXTREMITY WITH CONTRAST TECHNIQUE: Multidetector CT imaging of the lower bilateral extremity was performed according to the standard protocol following intravenous contrast administration. COMPARISON:  None. CONTRAST:  100mL ISOVUE-300 IOPAMIDOL (ISOVUE-300) INJECTION 61% FINDINGS: Bones/Joint/Cartilage There is misregistration artifact at the junction of the proximal middle third of the tibia and fibula on the reformatted images not be mistaken for fracture. No acute fracture of the tibia nor fibula is seen. There is slight femorotibial joint space narrowing of the knee. The visualized patellofemoral compartment is maintained without malalignment. No intra-articular loose bodies are noted within the knee. No acute tibial or fibular fracture is noted. There is no bone destruction. The ankle joint is maintained. Tiny subcortical cyst is noted posteriorly involving the talus. The subtalar joint is maintained. There is disorganization, subchondral and subcortical degenerative cystic lucencies involving the midfoot articulations especially at the base of the third through fifth metatarsals and between the lateral cuneiform and cuboid. Findings have the appearance of early Charcot joint. Ligaments Suboptimally assessed by CT. Muscles and Tendons No intramuscular mass or hematoma.  Intact Achilles tendon. Soft tissues There is diffuse soft tissue edema. Plantar ulceration along the lateral aspect of the midfoot with mild soft tissue thickening. No bone destruction of the adjacent midfoot. There is extensive calcific arteriosclerosis of the leg from above knee popliteal artery through tibial trunk involving the anterior and posterior tibial arteries, perineal and dorsalis pedis arteries. IMPRESSION: Soft tissue ulceration along the  plantar aspect of the midfoot without evidence of frank bone destruction. Disorganized and disrupted appearance of the midfoot articulations are more in keeping with Charcot joint. Extensive arteriosclerosis of the leg with diffuse mild soft tissue edema and induration. Electronically Signed   By: Tollie Ethavid  Kwon M.D.   On: 12/17/2015 00:07    EKG:   Orders placed or performed during the hospital encounter of 07/12/15  . ED EKG  . ED EKG  . EKG 12-Lead  . EKG 12-Lead    IMPRESSION AND PLAN:  Principal Problem:   Lower limb ischemia - heparin drip started, vascular surgery consult Active Problems:   Left leg DVT (HCC) - heparin drip as above   Cellulitis - IV antibiotics and place   Diabetes (HCC) - sliding scale insulin with corresponding glucose checks every 6 hours for now while patient is nothing by mouth   HTN (hypertension) - continue home meds   ESRD on dialysis [facility](HCC) - nephrology consult for dialysis support   HLD (hyperlipidemia) - continue home meds  All the records are reviewed and case discussed with ED provider. Management plans discussed with the patient and/or family.  DVT PROPHYLAXIS: Systemic anticoagulation  GI PROPHYLAXIS: None  ADMISSION STATUS: Inpatient  CODE STATUS: Full Code Status History    Date Active Date Inactive Code Status Order ID Comments User Context   07/12/2015  4:16 AM 07/13/2015  2:33 PM Full Code [id number]  Ihor Austin, MD Inpatient      TOTAL TIME TAKING CARE OF THIS PATIENT: 45 minutes.    Debria Broecker FIELDING 12/17/2015, 12:59 AM  [facility]  Office  [phone]  CC: Primary care physician; No PCP Per Patient

## 2015-12-17 NOTE — Progress Notes (Signed)
[facility] [place] at [facility]   PATIENT NAME: [name]    MR#:  [id number]  DATE OF BIRTH:  1970-08-22  SUBJECTIVE:  CHIEF COMPLAINT:  Patient is resting comfortably. Left leg pain is better. Denies any nausea or vomiting. Son at bedside  REVIEW OF SYSTEMS:  CONSTITUTIONAL: No fever, fatigue or weakness.  EYES: No blurred or double vision.  EARS, NOSE, AND THROAT: No tinnitus or ear pain.  RESPIRATORY: No cough, shortness of breath, wheezing or hemoptysis.  CARDIOVASCULAR: No chest pain, orthopnea, edema.  GASTROINTESTINAL: No nausea, vomiting, diarrhea or abdominal pain.  GENITOURINARY: No dysuria, hematuria.  ENDOCRINE: No polyuria, nocturia,  HEMATOLOGY: No anemia, easy bruising or bleeding SKIN: Left leg lesions MUSCULOSKELETAL: Reporting left leg pain improvement still has swelling  No joint pain or arthritis.   NEUROLOGIC: No tingling, numbness, weakness.  PSYCHIATRY: No anxiety or depression.   DRUG ALLERGIES:  No Known Allergies  VITALS:  Blood pressure (!) 147/47, pulse 77, temperature 99.5 F (37.5 C), temperature source Oral, resp. rate 18, height 5\' 5"  (1.651 m), weight 79 kg (174 lb 2.6 oz), SpO2 96 %.  PHYSICAL EXAMINATION:  GENERAL:  45 y.o.-year-old patient lying in the bed with no acute distress.  EYES: Pupils equal, round, reactive to light and accommodation. No scleral icterus. Extraocular muscles intact.  HEENT: Head atraumatic, normocephalic. Oropharynx and nasopharynx clear.  NECK:  Supple, no jugular venous distention. No thyroid enlargement, no tenderness.  LUNGS: Normal breath sounds bilaterally, no wheezing, rales,rhonchi or crepitation. No use of accessory muscles of respiration.  CARDIOVASCULAR: S1, S2 normal. No murmurs, rubs, or gallops.  ABDOMEN: Soft, nontender, nondistended. Bowel sounds present. No organomegaly or mass. Palpable left inguinal lymph node EXTREMITIES: Left lower extremity with arterial  changes , discoloration , tender in the calf area , erythematous , feeble dorsalis pedis and posterior tibialis pulse  Has multiple chronic raised, nontender nonerythematous, firm to hard lesions. No  discharge noticed. Soft tissue ulceration of the left plantar aspect noticed  No pedal edema, cyanosis, or clubbing.  NEUROLOGIC: Cranial nerves II through XII are intact. Muscle strength 5/5 in all extremities. Sensation intact. Gait not checked.  PSYCHIATRIC: The patient is alert and oriented x 3.  SKIN: No obvious rash, lesion, or ulcer.    LABORATORY PANEL:   CBC  Recent Labs Lab 12/17/15 0522  WBC 9.5  HGB 11.8*  HCT 34.2*  PLT 173   ------------------------------------------------------------------------------------------------------------------  Chemistries   Recent Labs Lab 12/16/15 1900 12/17/15 0522  NA 131* 134*  K 4.7 4.4  CL 92* 98*  CO2 26 23  GLUCOSE 248* 149*  BUN 38* 42*  CREATININE 6.05* 6.39*  CALCIUM 9.8 9.3  AST 16  --   ALT 10*  --   ALKPHOS 139*  --   BILITOT 0.8  --    ------------------------------------------------------------------------------------------------------------------  Cardiac Enzymes No results for input(s): TROPONINI in the last 168 hours. ------------------------------------------------------------------------------------------------------------------  RADIOLOGY:  [place] Venous Img Lower Unilateral Left  Result Date: 12/16/2015 CLINICAL DATA:  Acute onset of left leg swelling and pain. Initial encounter. EXAM: LEFT LOWER EXTREMITY VENOUS DOPPLER ULTRASOUND TECHNIQUE: Gray-scale sonography with graded compression, as well as color Doppler and duplex ultrasound were performed to evaluate the lower extremity deep venous systems from the level of the common femoral vein and including the common femoral, femoral, profunda femoral, popliteal and calf veins including the posterior tibial, peroneal and gastrocnemius veins when visible. The  superficial great saphenous vein was  also interrogated. Spectral Doppler was utilized to evaluate flow at rest and with distal augmentation maneuvers in the common femoral, femoral and popliteal veins. COMPARISON:  None. FINDINGS: Contralateral Common Femoral Vein: Respiratory phasicity is normal and symmetric with the symptomatic side. No evidence of thrombus. Normal compressibility. Common Femoral Vein: No evidence of thrombus. Normal compressibility, respiratory phasicity and response to augmentation. Saphenofemoral Junction: No evidence of thrombus. Normal compressibility and flow on color Doppler imaging. Profunda Femoral Vein: No evidence of thrombus. Normal compressibility and flow on color Doppler imaging. Femoral Vein: No evidence of thrombus. Normal compressibility, respiratory phasicity and response to augmentation. Popliteal Vein: No evidence of thrombus. Normal compressibility, respiratory phasicity and response to augmentation. Calf Veins: Occlusive thrombus is noted within within one of the patient's two left peroneal veins. Superficial Great Saphenous Vein: No evidence of thrombus. Normal compressibility and flow on color Doppler imaging. Venous Reflux:  None. Other Findings:  A 1.4 cm node is noted at the left inguinal region. IMPRESSION: 1. Occlusive deep venous thrombus noted within one of the patient's left peroneal veins. 2. 1.4 cm left inguinal node noted. These results were called by telephone at the time of interpretation on 12/16/2015 at 9:49 pm to Dr. Roxan Hockey, who verbally acknowledged these results. Electronically Signed   By: Roanna Raider M.D.   On: 12/16/2015 21:49   Ct Extrem Lower W Cm Bil  Result Date: 12/17/2015 CLINICAL DATA:  End-stage renal disease, diabetes and neuropathy. Patient complains of left leg and foot pain with blistering seen of the left leg. Occlusive deep venous thrombosis within 1 of the patient's left peroneal veins on same day lower extremity ultrasound.  EXAM: CT OF THE LOWER BILATERAL EXTREMITY WITH CONTRAST TECHNIQUE: Multidetector CT imaging of the lower bilateral extremity was performed according to the standard protocol following intravenous contrast administration. COMPARISON:  None. CONTRAST:  ISOVUE-300 IOPAMIDOL (ISOVUE-300) INJECTION 61% FINDINGS: Bones/Joint/Cartilage There is misregistration artifact at the junction of the proximal middle third of the tibia and fibula on the reformatted images not be mistaken for fracture. No acute fracture of the tibia nor fibula is seen. There is slight femorotibial joint space narrowing of the knee. The visualized patellofemoral compartment is maintained without malalignment. No intra-articular loose bodies are noted within the knee. No acute tibial or fibular fracture is noted. There is no bone destruction. The ankle joint is maintained. Tiny subcortical cyst is noted posteriorly involving the talus. The subtalar joint is maintained. There is disorganization, subchondral and subcortical degenerative cystic lucencies involving the midfoot articulations especially at the base of the third through fifth metatarsals and between the lateral cuneiform and cuboid. Findings have the appearance of early Charcot joint. Ligaments Suboptimally assessed by CT. Muscles and Tendons No intramuscular mass or hematoma.  Intact Achilles tendon. Soft tissues There is diffuse soft tissue edema. Plantar ulceration along the lateral aspect of the midfoot with mild soft tissue thickening. No bone destruction of the adjacent midfoot. There is extensive calcific arteriosclerosis of the leg from above knee popliteal artery through tibial trunk involving the anterior and posterior tibial arteries, perineal and dorsalis pedis arteries. IMPRESSION: Soft tissue ulceration along the plantar aspect of the midfoot without evidence of frank bone destruction. Disorganized and disrupted appearance of the midfoot articulations are more in keeping  with Charcot joint. Extensive arteriosclerosis of the leg with diffuse mild soft tissue edema and induration. Electronically Signed   By: Tollie Eth M.D.   On: 12/17/2015 00:07    EKG:  Orders placed or performed during the hospital encounter of 07/12/15  . ED EKG  . ED EKG  . EKG 12-Lead  . EKG 12-Lead    ASSESSMENT AND PLAN:   45 year old Hispanic female came into the ED with a chief complaint of left lower extremity pain and swelling  # Lower limb ischemia -  Continue heparin drip , vascular surgery consult pending   # Left leg DVT (HCC) - continue heparin drip as above     #left lower leg Cellulitis with mid plantar ulceration - IV Zosyn and vancomycin  #Left lower leg lesion-unclear etiology Blood cultures will be done during dialysis discussed with Dr. Thedore Mins Outpatient follow-up with dermatology for skin biopsy is recommended Consult infectious disease for their expert opinion  #  Diabetes (HCC) - sliding scale insulin with corresponding glucose checks every 6 hours for now while patient is nothing by mouth  #  HTN (hypertension) - continue home meds    # ESRD on dialysis [facility]) - nephrology consulted for dialysis     #HLD (hyperlipidemia) - continue home meds once patient starts taking by mouth  Plan of care discussed with Dr. Thedore Mins  All the records are reviewed and case discussed with Care Management/Social Workerr. Management plans discussed with the patient, son With the help of Spanish-speaking interpreter miss Damian Leavell and they are in agreement.  CODE STATUS: fc  TOTAL TIME TAKING CARE OF THIS PATIENT: 35 minutes.   POSSIBLE D/C IN 3-4 DAYS, DEPENDING ON CLINICAL CONDITION.  Note: This dictation was prepared with Dragon dictation along with smaller phrase technology. Any transcriptional errors that result from this process are unintentional.   Ramonita Lab M.D on 12/17/2015 at 11:46 AM  Between 7am to 6pm - Pager - [phone] After 6pm go to  [email or web address] - password [email or web address] [facility]  [facility]  Office  [phone]  CC: Primary care physician; No PCP Per Patient

## 2015-12-17 NOTE — Progress Notes (Signed)
Subjective:   Patient known to our practice from previous admissions.  She presented to the emergency room for left leg and foot pain.  She also reports relatively new onset of blisters that are turning into scabbed lesions on the left leg.  She has some ischemic changes on her left toe and an eschar over her posterior calf. Lower extremity Doppler shows occlusive deep venous thrombosis of left peroneal vein. 1.4 cm enlarged lymph node noted in the left inguinal region. Lesions over her left leg are occasionally painful. Nephrology consult has been requesteo arrange for dialysis. She dialyzes at [facility].  Tuesday/Thursday/Saturday No complaints of shortness of breath She has left upper extremity AV access  Objective:  Vital signs in last 24 hours:  Temp:  [98.5 F (36.9 C)-99.5 F (37.5 C)] 99.5 F (37.5 C) (10/21 0757) Pulse Rate:  [77-99] 77 (10/21 0757) Resp:  [16-18] 18 (10/21 0757) BP: (103-168)/(47-93) 147/47 (10/21 0757) SpO2:  [96 %-100 %] 96 % (10/21 0757) Weight:  [79 kg (174 lb 2.6 oz)] 79 kg (174 lb 2.6 oz) (10/20 1855)  Weight change:  Filed Weights   12/16/15 1855  Weight: 79 kg (174 lb 2.6 oz)    Intake/Output:    Intake/Output Summary (Last 24 hours) at 12/17/15 1243 Last data filed at 12/17/15 0640  Gross per 24 hour  Intake              550 ml  Output                0 ml  Net              550 ml     Physical Exam: General: No acute distress laying in the bed  HEENT Anicteric, moist oral mucous membranes  Neck supple  Pulm/lungs Normal breathing effort, clear to auscultation  CVS/Heart regular, no rub or gallop  Abdomen:  Soft, nontender, nondistended  Extremities: No pedal edema  Neurologic: Alert and oriented  Skin: Discrete skin lesions ranging up to 2 cm, layered skin appearance. Necrotic lesions over left .  Gangreneous toes of left foot  Access: Left upper extremity AV graft.  Good bruit and thrill       Basic  Metabolic Panel:   Recent Labs Lab 12/16/15 1900 12/17/15 0522  NA 131* 134*  K 4.7 4.4  CL 92* 98*  CO2 26 23  GLUCOSE 248* 149*  BUN 38* 42*  CREATININE 6.05* 6.39*  CALCIUM 9.8 9.3     CBC:  Recent Labs Lab 12/16/15 1900 12/17/15 0522  WBC 12.3* 9.5  NEUTROABS 10.0*  --   HGB 12.9 11.8*  HCT 39.7 34.2*  MCV 87.2 85.6  PLT 196 173      Microbiology:  Recent Results (from the past 720 hour(s))  MRSA PCR Screening     Status: None   Collection Time: 12/17/15  3:14 AM  Result Value Ref Range Status   MRSA by PCR NEGATIVE NEGATIVE Final    Comment:        The GeneXpert MRSA Assay (FDA approved for NASAL specimens only), is one component of a comprehensive MRSA colonization surveillance program. It is not intended to diagnose MRSA infection nor to guide or monitor treatment for MRSA infections.     Coagulation Studies:  Recent Labs  12/16/15 2354  LABPROT 15.7*  INR 1.24    Urinalysis: No results for input(s): COLORURINE, LABSPEC, PHURINE, GLUCOSEU, HGBUR, BILIRUBINUR, KETONESUR, PROTEINUR, UROBILINOGEN, NITRITE, LEUKOCYTESUR in the  last 72 hours.  Invalid input(s): APPERANCEUR    Imaging: US Venous Img Lower Unilateral Left  Result Date: 12/16/2015 CLINICAL DATA:  Acute onset of left leg swelling and pain. Initial encounter. EXAM: LEFT LOWER EXTREMITY VENOUS DOPPLER ULTRASOUND TECHNIQUE: Gray-scale sonography with graded compression, as well as color Doppler and duplex ultrasound were performed to evaluate the lower extremity deep venous systems from the level of the common femoral vein and including the common femoral, femoral, profunda femoral, popliteal and calf veins including the posterior tibial, peroneal and gastrocnemius veins when visible. The superficial great saphenous vein was also interrogated. Spectral Doppler was utilized to evaluate flow at rest and with distal augmentation maneuvers in the common femoral, femoral and popliteal  veins. COMPARISON:  None. FINDINGS: Contralateral Common Femoral Vein: Respiratory phasicity is normal and symmetric with the symptomatic side. No evidence of thrombus. Normal compressibility. Common Femoral Vein: No evidence of thrombus. Normal compressibility, respiratory phasicity and response to augmentation. Saphenofemoral Junction: No evidence of thrombus. Normal compressibility and flow on color Doppler imaging. Profunda Femoral Vein: No evidence of thrombus. Normal compressibility and flow on color Doppler imaging. Femoral Vein: No evidence of thrombus. Normal compressibility, respiratory phasicity and response to augmentation. Popliteal Vein: No evidence of thrombus. Normal compressibility, respiratory phasicity and response to augmentation. Calf Veins: Occlusive thrombus is noted within within one of the patient's two left peroneal veins. Superficial Great Saphenous Vein: No evidence of thrombus. Normal compressibility and flow on color Doppler imaging. Venous Reflux:  None. Other Findings:  A 1.4 cm node is noted at the left inguinal region. IMPRESSION: 1. Occlusive deep venous thrombus noted within one of the patient's left peroneal veins. 2. 1.4 cm left inguinal node noted. These results were called by telephone at the time of interpretation on 12/16/2015 at 9:49 pm to Dr. Roxan Hockey, who verbally acknowledged these results. Electronically Signed   By: Roanna Raider M.D.   On: 12/16/2015 21:49   Ct Extrem Lower W Cm Bil  Result Date: 12/17/2015 CLINICAL DATA:  End-stage renal disease, diabetes and neuropathy. Patient complains of left leg and foot pain with blistering seen of the left leg. Occlusive deep venous thrombosis within 1 of the patient's left peroneal veins on same day lower extremity ultrasound. EXAM: CT OF THE LOWER BILATERAL EXTREMITY WITH CONTRAST TECHNIQUE: Multidetector CT imaging of the lower bilateral extremity was performed according to the standard protocol following intravenous  contrast administration. COMPARISON:  None. CONTRAST:  ISOVUE-300 IOPAMIDOL (ISOVUE-300) INJECTION 61% FINDINGS: Bones/Joint/Cartilage There is misregistration artifact at the junction of the proximal middle third of the tibia and fibula on the reformatted images not be mistaken for fracture. No acute fracture of the tibia nor fibula is seen. There is slight femorotibial joint space narrowing of the knee. The visualized patellofemoral compartment is maintained without malalignment. No intra-articular loose bodies are noted within the knee. No acute tibial or fibular fracture is noted. There is no bone destruction. The ankle joint is maintained. Tiny subcortical cyst is noted posteriorly involving the talus. The subtalar joint is maintained. There is disorganization, subchondral and subcortical degenerative cystic lucencies involving the midfoot articulations especially at the base of the third through fifth metatarsals and between the lateral cuneiform and cuboid. Findings have the appearance of early Charcot joint. Ligaments Suboptimally assessed by CT. Muscles and Tendons No intramuscular mass or hematoma.  Intact Achilles tendon. Soft tissues There is diffuse soft tissue edema. Plantar ulceration along the lateral aspect of the midfoot with mild soft tissue  thickening. No bone destruction of the adjacent midfoot. There is extensive calcific arteriosclerosis of the leg from above knee popliteal artery through tibial trunk involving the anterior and posterior tibial arteries, perineal and dorsalis pedis arteries. IMPRESSION: Soft tissue ulceration along the plantar aspect of the midfoot without evidence of frank bone destruction. Disorganized and disrupted appearance of the midfoot articulations are more in keeping with Charcot joint. Extensive arteriosclerosis of the leg with diffuse mild soft tissue edema and induration. Electronically Signed   By: Tollie Ethavid  Kwon M.D.   On: 12/17/2015 00:07      Medications:   . heparin 1,350 Units/hr (12/17/15 1226)   . insulin aspart  0-9 Units Subcutaneous Q6H  . piperacillin-tazobactam (ZOSYN)  IV  3.375 g Intravenous Q12H  . sodium chloride flush  3 mL Intravenous Q12H  . vancomycin  750 mg Intravenous Q T,Th,Sa-HD   acetaminophen **OR** acetaminophen, fentaNYL (SUBLIMAZE) injection, heparin, lidocaine-prilocaine, ondansetron **OR** ondansetron (ZOFRAN) IV, oxyCODONE  Assessment/ Plan:  45 y.o. Hispanic female with end-stage renal disease, hemodialysis, diabetes mellitus with nephropathy, peripheral arterial disease, Charcot joint, gangrene of left toes, presents for pain and swelling in left leg  1.  End-stage renal disease.  [facility].  [facility]. 2. Anemia of chronic kidney disease 3.  Secondary hyperparathyroidism 4.  Left leg DVT -left peroneal vein 5.  Left leg skin lesions, 1.4 cm lymph node 6.  peripheral arterial disease   Arrange for dialysis while in the hospital.  Patient's daughter weight is 77 kg according to the patient. We will adjust ultrafiltration with dialysis as necessary.  She has left upper extremity access.  We will monitor phosphorus during this admission.  Will also administer Procrit if hemoglobin is less than 11.  She is currently getting IV hepatin drip for peritoneal DVT.  She is undergoing vascular surgery evaluation for peripheral arterial disease as well as gangrene of left toes. Patient has developed left leg lesions over the past 2 months.  She has an enlarged lymph nodes.  It needs dermatology evaluation which probably will be done as outpatient. We will obtain blood cultures during dialysis treatment.  We will also obtain phosphorus level to ensure that high phosphorus levels are not contributing to the problem. Conversation with patient and family were done through an interpreter. Case discussed with vascular surgery PA and Dr. Amado CoeGouru   Will follow.   LOS:  0 Khamryn Calderone 10/21/201712:43 PM

## 2015-12-17 NOTE — Progress Notes (Signed)
Hd start 

## 2015-12-17 NOTE — Progress Notes (Signed)
Pt is a dialysis pt and has normal saline ordered at 7975ml/hr. Spoke with Dr. Sheryle Hailiamond may discontinue.

## 2015-12-17 NOTE — Progress Notes (Signed)
Pre hd info 

## 2015-12-17 NOTE — Progress Notes (Signed)
Pt has had rounds from hospitalist, thoracic, and nephrology. Interpreter and pt's son present for all. Pt understands current plan of care includes continuing heparin drip, dialysis to begin today, an angiogram on 10/23, podiatry consult, and ID consult. Pt was able to walk to the bathroom to wash up by using handholds and returned to bed. Pt has consumed all of her lunch.

## 2015-12-17 NOTE — Progress Notes (Signed)
ANTICOAGULATION CONSULT NOTE - FOLLOW UP  Consult  Pharmacy Consult for heparin drip Indication: VTE treatment  No Known Allergies  Patient Measurements: Height: 5\' 5"  (165.1 cm) Weight: 174 lb 2.6 oz (79 kg) IBW/kg (Calculated) : 57 Heparin Dosing Weight: 74 kg  Vital Signs: Temp: 99.5 F (37.5 C) (10/21 0757) Temp Source: Oral (10/21 0757) BP: 147/47 (10/21 0757) Pulse Rate: 77 (10/21 0757)  Labs:  Recent Labs  12/16/15 1900 12/16/15 2354 12/17/15 0522 12/17/15 0953  HGB 12.9  --  11.8*  --   HCT 39.7  --  34.2*  --   PLT 196  --  173  --   APTT  --  37*  --   --   LABPROT  --  15.7*  --   --   INR  --  1.24  --   --   HEPARINUNFRC  --   --   --  0.14*  CREATININE 6.05*  --  6.39*  --     Estimated Creatinine Clearance: 11.3 mL/min (by C-G formula based on SCr of 6.39 mg/dL (H)).   Medical History: Past Medical History:  Diagnosis Date  . Diabetes 1.5, managed as type 2 (HCC)   . ESRD on dialysis (HCC)   . Hyperlipidemia   . Hypertension   . Neuropathy (HCC)   . Renal insufficiency     Medications:  No anticoagulation in PTA meds  Assessment:  Goal of Therapy:  Heparin level 0.3-0.7 units/ml Monitor platelets by anticoagulation protocol: Yes   Plan:  Heparin level @ 0.14.   Will give Heparin bolus of 2200 units. Will increase heparin gtt to 1350 units/hr. Will recheck Heparin level @ 2000.  Davine Coba D 12/17/2015,11:20 AM

## 2015-12-17 NOTE — Progress Notes (Signed)
ANTICOAGULATION CONSULT NOTE - Initial Consult  Pharmacy Consult for heparin drip Indication: VTE treatment  No Known Allergies  Patient Measurements: Height: 5\' 5"  (165.1 cm) Weight: 174 lb 2.6 oz (79 kg) IBW/kg (Calculated) : 57 Heparin Dosing Weight: 74 kg  Vital Signs: BP: 112/54 (10/20 2320) Pulse Rate: 94 (10/20 2320)  Labs:  Recent Labs  12/16/15 1900 12/16/15 2354  HGB 12.9  --   HCT 39.7  --   PLT 196  --   APTT  --  37*  LABPROT  --  15.7*  INR  --  1.24  CREATININE 6.05*  --     Estimated Creatinine Clearance: 11.9 mL/min (by C-G formula based on SCr of 6.05 mg/dL (H)).   Medical History: Past Medical History:  Diagnosis Date  . Diabetes 1.5, managed as type 2 (HCC)   . ESRD on dialysis (HCC)   . Hyperlipidemia   . Hypertension   . Neuropathy (HCC)   . Renal insufficiency     Medications:  No anticoagulation in PTA meds  Assessment:  Goal of Therapy:  Heparin level 0.3-0.7 units/ml Monitor platelets by anticoagulation protocol: Yes   Plan:  4500 unit bolus and initial rate of 1100 units/hr. First heparin level 8 hours after start of infusion.  [name] S 12/17/2015,12:35 AM

## 2015-12-17 NOTE — Progress Notes (Signed)
Pharmacy Antibiotic Note  [name] is a 45 y.o. female admitted on 12/16/2015 with cellulitis.  Pharmacy has been consulted for vancomycin and Zosyn dosing.  Plan: ESRD on HD DW 66kg   Vancomycin 1500 mg x1 loading dose ordered. 750 mg every HD. Level will need to be ordered when HD schedule is determined.  Zosyn 3.375 grams q 12 hours ordered.  Height: 5\' 5"  (165.1 cm) Weight: 174 lb 2.6 oz (79 kg) IBW/kg (Calculated) : 57  Temp (24hrs), Avg:98.5 F (36.9 C), Min:98.5 F (36.9 C), Max:98.5 F (36.9 C)   Recent Labs Lab 12/16/15 1900 12/16/15 2221  WBC 12.3*  --   CREATININE 6.05*  --   LATICACIDVEN  --  1.1    Estimated Creatinine Clearance: 11.9 mL/min (by C-G formula based on SCr of 6.05 mg/dL (H)).    No Known Allergies  Antimicrobials this admission: vancomycin  >>  Zosyn  >>   Dose adjustments this admission:   Microbiology results: No micro   Thank you for allowing pharmacy to be a part of this patient's care.  Ranier Coach S 12/17/2015 2:32 AM

## 2015-12-17 NOTE — Progress Notes (Signed)
Dr. Amado CoeGouru in to see pt, secured pt's permission to photograph her leg, specifically the lesions on it that are raised and scabbed, to send to ID physician on consult. Pt then left the floor to go to dialysis via her bed, heparin drip continued.

## 2015-12-17 NOTE — Consult Note (Signed)
[name] Vascular Consult Note  MRN : [id number]  [name] is a 45 y.o. (10-20-1970) female who presents with chief complaint of  Chief Complaint  Patient presents with  . Leg Pain  . Foot Pain   History of Present Illness:  The patient is a 45 year old female with a PMHx of ESRD on dialysis, HTN, hyperlipidemia, DM and peripheral neuropathy who presented yesterday to the [facility] ED with a chief complaint of left foot pain. The patient endorses a two month history of worsening left lower extremity pain located mostly in her left foot. She does state claudication symptoms located in distal aspect of left calf and foot. Reports pain in her left foot which will wake her up at night. There is gangrene of the left 3rd and 4th toes and the beginning of an ulceration of the left first toe. Patient states this also started to appear approximately two months ago. This has also progressively worsened. No local wound care as per patient. She denies any fever, nausea or vomiting.   Patient has a left upper extremity dialysis access. She states this is working fine and there are no issues with her dialysis treatments. Denies any skin breakdown.   Patient was found to have an occlusive left peroneal DVT during workup in ED. Denies any worsening edema to the extremity. She denies any SOB or chest pain. Denies a history of DVT.   The patient has a long standing history of "growths" on both of her lower extremities. They are painless but have grown in size and amount over time. Denies any ulceration or drainage associated with these growths.   Please note an interpreter was present during this patient encounter as the patient is spanish speaking.  Consulted by primary team Dr. Anne HahnWillis for left lower extremity DVT and ischemia for further recommendations.   Current Facility-Administered Medications  Medication Dose Route Frequency Provider Last Rate Last Dose  . acetaminophen  (TYLENOL) tablet 650 mg  650 mg Oral Q6H PRN Oralia Manisavid Willis, MD       Or  . acetaminophen (TYLENOL) suppository 650 mg  650 mg Rectal Q6H PRN Oralia Manisavid Willis, MD      . fentaNYL (SUBLIMAZE) injection 50 mcg  50 mcg Intravenous Q1H PRN Willy EddyPatrick Robinson, MD   50 mcg at 12/17/15 0905  . heparin ADULT infusion 100 units/mL (25000 units/24250mL sodium chloride 0.45%)  1,350 Units/hr Intravenous Continuous Willy EddyPatrick Robinson, MD 13.5 mL/hr at 12/17/15 1226 1,350 Units/hr at 12/17/15 1226  . insulin aspart (novoLOG) injection 0-9 Units  0-9 Units Subcutaneous Q6H Oralia Manisavid Willis, MD   2 Units at 12/17/15 [phone]  . ondansetron (ZOFRAN) tablet 4 mg  4 mg Oral Q6H PRN Oralia Manisavid Willis, MD       Or  . ondansetron [facility](ZOFRAN) injection 4 mg  4 mg Intravenous Q6H PRN Oralia Manisavid Willis, MD      . oxyCODONE (Oxy IR/ROXICODONE) immediate release tablet 5 mg  5 mg Oral Q4H PRN Oralia Manisavid Willis, MD      . piperacillin-tazobactam (ZOSYN) IVPB 3.375 g  3.375 g Intravenous Q12H Oralia Manisavid Willis, MD   3.375 g at 12/17/15 0432  . sodium chloride flush (NS) 0.9 % injection 3 mL  3 mL Intravenous Q12H Oralia Manisavid Willis, MD      . vancomycin (VANCOCIN) IVPB 750 mg/150 ml premix  750 mg Intravenous Q T,Th,Sa-HD Oralia Manisavid Willis, MD       Past Medical History:  Diagnosis Date  . Diabetes 1.5,  managed as type 2 (HCC)   . ESRD on dialysis (HCC)   . Hyperlipidemia   . Hypertension   . Neuropathy (HCC)   . Renal insufficiency    Past Surgical History:  Procedure Laterality Date  . AV FISTULA PLACEMENT    . LEG SURGERY     Social History Social History  Substance Use Topics  . Smoking status: Never Smoker  . Smokeless tobacco: Never Used  . Alcohol use No   Family History Family History  Problem Relation Age of Onset  . Diabetes Mellitus II Father   Denies a family history of PAD, Venous or Renal Disease.   No Known Allergies  REVIEW OF SYSTEMS (Negative unless checked)  Constitutional: [] Weight loss  [] Fever  [] Chills Cardiac: [] Chest pain    [] Chest pressure   [] Palpitations   [] Shortness of breath when laying flat   [] Shortness of breath at rest   [] Shortness of breath with exertion. Vascular:  [x] Pain in legs with walking   [x] Pain in legs at rest   [x] Pain in legs when laying flat   [x] Claudication   [x] Pain in feet when walking  [x] Pain in feet at rest  [x] Pain in feet when laying flat   [] History of DVT   [] Phlebitis   [] Swelling in legs   [] Varicose veins   [x] Non-healing ulcers Pulmonary:   [] Uses home oxygen   [] Productive cough   [] Hemoptysis   [] Wheeze  [] COPD   [] Asthma Neurologic:  [] Dizziness  [] Blackouts   [] Seizures   [] History of stroke   [] History of TIA  [] Aphasia   [] Temporary blindness   [] Dysphagia   [] Weakness or numbness in arms   [x] Weakness or numbness in legs Musculoskeletal:  [] Arthritis   [] Joint swelling   [] Joint pain   [] Low back pain Hematologic:  [] Easy bruising  [] Easy bleeding   [] Hypercoagulable state   [] Anemic  [] Hepatitis Gastrointestinal:  [] Blood in stool   [] Vomiting blood  [] Gastroesophageal reflux/heartburn   [] Difficulty swallowing. Genitourinary:  [x] Chronic kidney disease   [] Difficult urination  [] Frequent urination  [] Burning with urination   [] Blood in urine Skin:  [] Rashes   [x] Ulcers   [x] Wounds Psychological:  [] History of anxiety   []  History of major depression.  Physical Examination  Vitals:   12/17/15 0130 12/17/15 0213 12/17/15 0640 12/17/15 0757  BP: (!) 127/93 (!) 162/83 (!) 154/88 (!) 147/47  Pulse: 85 89 78 77  Resp:  16 16 18   Temp:  98.5 F (36.9 C) 98.5 F (36.9 C) 99.5 F (37.5 C)  TempSrc:  Axillary Oral Oral  SpO2: 100% 98% 98% 96%  Weight:      Height:       Body mass index is 28.98 kg/m. Gen:  WD/WN, NAD Head: [place]/AT, No temporalis wasting. Prominent temp pulse not noted. Ear/Nose/Throat: Hearing grossly intact, nares w/o erythema or drainage, oropharynx w/o Erythema/Exudate Eyes: PERRLA, EOMI.  Neck: Supple, no nuchal rigidity.  No bruit or JVD.   Pulmonary:  Good air movement, clear to auscultation bilaterally.  Cardiac: RRR, normal S1, S2, no Murmurs, rubs or gallops. Vascular:  Vessel Right Left  Radial Palpable Palpable  Ulnar Palpable Palpable  Brachial Palpable Palpable  Carotid Palpable, without bruit Palpable, without bruit  Aorta Not palpable N/A  Femoral Palpable Palpable  Popliteal Palpable Palpable  PT Non-Palpable Non-Palpable  DP Non-Palpable Non-Palpable   Left Upper Extremity: Dialysis Access - Skin intact. Good bruit and thrill.  Left Lower Extremity: No palpable pedal pulses noted. Non-tender to palpation. No  pain with dorsiflexion. Minimal edema. Erythematous. Numerous white, calcified growths noted primarily in distal aspect of leg.   Left Foot: Ulceration noted on left big toe. 3rd and 4th toe with some gangrene noted.  Right Lower Extremity: No palpable pedal pulses noted. Non-tender to palpation. Minimal edema. Numerous white, calcified growths noted primarily in distal aspect of leg.   Gastrointestinal: soft, non-tender/non-distended. No guarding/reflex.  Musculoskeletal: M/S 5/5 throughout.  Extremities with ischemic changes.  Neurologic: CN 2-12 intact. Pain and light touch intact in extremities.  Symmetrical.  Speech is fluent. Motor exam as listed above. Psychiatric: Judgment intact, Mood & affect appropriate for pt's clinical situation. Dermatologic: See above. Lymph : No Cervical, Axillary, or Inguinal lymphadenopathy.  CBC Lab Results  Component Value Date   WBC 9.5 12/17/2015   HGB 11.8 (L) 12/17/2015   HCT 34.2 (L) 12/17/2015   MCV 85.6 12/17/2015   PLT 173 12/17/2015   BMET    Component Value Date/Time   NA 134 (L) 12/17/2015 0522   NA 130 (L) 09/11/2011 1224   K 4.4 12/17/2015 0522   K 4.1 09/11/2011 1224   CL 98 (L) 12/17/2015 0522   CL 91 (L) 09/11/2011 1224   CO2 23 12/17/2015 0522   CO2 27 09/11/2011 1224   GLUCOSE 149 (H) 12/17/2015 0522   GLUCOSE 196 (H) 09/11/2011 1224    BUN 42 (H) 12/17/2015 0522   BUN 28 (H) 09/11/2011 1224   CREATININE 6.39 (H) 12/17/2015 0522   CREATININE 3.50 (H) 09/11/2011 1224   CALCIUM 9.3 12/17/2015 0522   CALCIUM 9.8 09/11/2011 1224   GFRNONAA 7 (L) 12/17/2015 0522   GFRNONAA 15 (L) 09/11/2011 1224   GFRAA 8 (L) 12/17/2015 0522   GFRAA 18 (L) 09/11/2011 1224   Estimated Creatinine Clearance: 11.3 mL/min (by C-G formula based on SCr of 6.39 mg/dL (H)).  COAG Lab Results  Component Value Date   INR 1.24 12/16/2015   Radiology US Venous Img Lower Unilateral Left  Result Date: 12/16/2015 CLINICAL DATA:  Acute onset of left leg swelling and pain. Initial encounter. EXAM: LEFT LOWER EXTREMITY VENOUS DOPPLER ULTRASOUND TECHNIQUE: Gray-scale sonography with graded compression, as well as color Doppler and duplex ultrasound were performed to evaluate the lower extremity deep venous systems from the level of the common femoral vein and including the common femoral, femoral, profunda femoral, popliteal and calf veins including the posterior tibial, peroneal and gastrocnemius veins when visible. The superficial great saphenous vein was also interrogated. Spectral Doppler was utilized to evaluate flow at rest and with distal augmentation maneuvers in the common femoral, femoral and popliteal veins. COMPARISON:  None. FINDINGS: Contralateral Common Femoral Vein: Respiratory phasicity is normal and symmetric with the symptomatic side. No evidence of thrombus. Normal compressibility. Common Femoral Vein: No evidence of thrombus. Normal compressibility, respiratory phasicity and response to augmentation. Saphenofemoral Junction: No evidence of thrombus. Normal compressibility and flow on color Doppler imaging. Profunda Femoral Vein: No evidence of thrombus. Normal compressibility and flow on color Doppler imaging. Femoral Vein: No evidence of thrombus. Normal compressibility, respiratory phasicity and response to augmentation. Popliteal Vein: No  evidence of thrombus. Normal compressibility, respiratory phasicity and response to augmentation. Calf Veins: Occlusive thrombus is noted within within one of the patient's two left peroneal veins. Superficial Great Saphenous Vein: No evidence of thrombus. Normal compressibility and flow on color Doppler imaging. Venous Reflux:  None. Other Findings:  A 1.4 cm node is noted at the left inguinal region. IMPRESSION: 1. Occlusive deep venous  thrombus noted within one of the patient's left peroneal veins. 2. 1.4 cm left inguinal node noted. These results were called by telephone at the time of interpretation on 12/16/2015 at 9:49 pm to Dr. Roxan Hockey, who verbally acknowledged these results. Electronically Signed   By: Roanna Raider M.D.   On: 12/16/2015 21:49   Ct Extrem Lower W Cm Bil  Result Date: 12/17/2015 CLINICAL DATA:  End-stage renal disease, diabetes and neuropathy. Patient complains of left leg and foot pain with blistering seen of the left leg. Occlusive deep venous thrombosis within 1 of the patient's left peroneal veins on same day lower extremity ultrasound. EXAM: CT OF THE LOWER BILATERAL EXTREMITY WITH CONTRAST TECHNIQUE: Multidetector CT imaging of the lower bilateral extremity was performed according to the standard protocol following intravenous contrast administration. COMPARISON:  None. CONTRAST:  ISOVUE-300 IOPAMIDOL (ISOVUE-300) INJECTION 61% FINDINGS: Bones/Joint/Cartilage There is misregistration artifact at the junction of the proximal middle third of the tibia and fibula on the reformatted images not be mistaken for fracture. No acute fracture of the tibia nor fibula is seen. There is slight femorotibial joint space narrowing of the knee. The visualized patellofemoral compartment is maintained without malalignment. No intra-articular loose bodies are noted within the knee. No acute tibial or fibular fracture is noted. There is no bone destruction. The ankle joint is maintained. Tiny  subcortical cyst is noted posteriorly involving the talus. The subtalar joint is maintained. There is disorganization, subchondral and subcortical degenerative cystic lucencies involving the midfoot articulations especially at the base of the third through fifth metatarsals and between the lateral cuneiform and cuboid. Findings have the appearance of early Charcot joint. Ligaments Suboptimally assessed by CT. Muscles and Tendons No intramuscular mass or hematoma.  Intact Achilles tendon. Soft tissues There is diffuse soft tissue edema. Plantar ulceration along the lateral aspect of the midfoot with mild soft tissue thickening. No bone destruction of the adjacent midfoot. There is extensive calcific arteriosclerosis of the leg from above knee popliteal artery through tibial trunk involving the anterior and posterior tibial arteries, perineal and dorsalis pedis arteries. IMPRESSION: Soft tissue ulceration along the plantar aspect of the midfoot without evidence of frank bone destruction. Disorganized and disrupted appearance of the midfoot articulations are more in keeping with Charcot joint. Extensive arteriosclerosis of the leg with diffuse mild soft tissue edema and induration. Electronically Signed   By: Tollie Eth M.D.   On: 12/17/2015 00:07   Assessment/Plan The patient is a 45 year old female with a PMHx of ESRD on dialysis, HTN, hyperlipidemia, DM and peripheral neuropathy presents with left lower extremity PAD with ulceration.  1) PAD: Patient with multiple risk factors for PAD. Presents with gangrene and ulceration to left lower extremity toes with absent pedal pulses. Recommend left lower extremity angiogram in an attempt to revascularize the extremity. This is planned for Tuesday with Dr. Gilda Crease. Procedure, risks and benefits explained to patient with interpreter and family member present. All questions answered. Patient wishes to proceed. Patient would benefit from RLE workup as an outpatient and  possible intervention on the right side in the future. Will consult podiatry as she may need amputation of toes in future.   2) Left Lower Extremity Peroneal DVT: Physical exam unremarkable no indication for venous lysis / IVC filter at this time. Agree with heparin gtt.   3) ESRD on Dialysis: Left upper extremity access working well. Access with good bruit and thrill. No indication for intervention at this time.   4)  Bilateral Lower Extremity Growths: Etiology unclear. Would recommend dermatology consult for biopsy.   5) Hyperlipidemia - Currently not on a statin. Unknown last lipid panel. Possibly consider adding statin if appropriate as it slows the progression of atherosclerotic disease.   6) DM: Unsure of home regimen if any. Would consider ordering HbgA1c? Encourage good control as uncontrolled DM worsens PAD.   Discussed with Dr. Romie Jumper, PA-C  12/17/2015 12:28 PM

## 2015-12-17 NOTE — Progress Notes (Signed)
Dialysis complete

## 2015-12-18 LAB — CBC
HEMATOCRIT: 34.7 % — AB (ref 35.0–47.0)
HEMOGLOBIN: 11.9 g/dL — AB (ref 12.0–16.0)
MCH: 29.4 pg (ref 26.0–34.0)
MCHC: 34.1 g/dL (ref 32.0–36.0)
MCV: 86.1 fL (ref 80.0–100.0)
Platelets: 183 10*3/uL (ref 150–440)
RBC: 4.03 MIL/uL (ref 3.80–5.20)
RDW: 14.9 % — ABNORMAL HIGH (ref 11.5–14.5)
WBC: 9.3 10*3/uL (ref 3.6–11.0)

## 2015-12-18 LAB — BASIC METABOLIC PANEL
ANION GAP: 11 (ref 5–15)
BUN: 19 mg/dL (ref 6–20)
CALCIUM: 9.3 mg/dL (ref 8.9–10.3)
CHLORIDE: 97 mmol/L — AB (ref 101–111)
CO2: 28 mmol/L (ref 22–32)
Creatinine, Ser: 4.05 mg/dL — ABNORMAL HIGH (ref 0.44–1.00)
GFR calc non Af Amer: 12 mL/min — ABNORMAL LOW (ref >60)
GFR, EST AFRICAN AMERICAN: 14 mL/min — AB (ref >60)
GLUCOSE: 166 mg/dL — AB (ref 65–99)
POTASSIUM: 4.4 mmol/L (ref 3.5–5.1)
Sodium: 136 mmol/L (ref 135–145)

## 2015-12-18 LAB — GLUCOSE, CAPILLARY
GLUCOSE-CAPILLARY: 132 mg/dL — AB (ref 65–99)
Glucose-Capillary: 169 mg/dL — ABNORMAL HIGH (ref 65–99)
Glucose-Capillary: 195 mg/dL — ABNORMAL HIGH (ref 65–99)
Glucose-Capillary: 272 mg/dL — ABNORMAL HIGH (ref 65–99)

## 2015-12-18 MED ORDER — HEPARIN BOLUS VIA INFUSION
1100.0000 [IU] | INTRAVENOUS | Status: AC
  Administered 2015-12-18: 1100 [IU] via INTRAVENOUS
  Filled 2015-12-18: qty 1100

## 2015-12-18 NOTE — Progress Notes (Signed)
Shift assessment completed. Pt is awake, alert and oriented. Pt is on room air, lungs are clear bilat. S1S2 heard, pt has telebox in place with Sr noted. Abdomen is soft, bs heard. PIV #20 intact to R arm with heparin drip infusing at 1350units.hr, piv #22 intact to R arm as well, both sites are free of redness and swelling. Pt has pink sleeve intact to l arm with fistula site to LUA dry. Pt's Rppp, leg with no edema. L leg has edema, multiple lesion that are raised, grayish in color and some scabbed in the center, foot is edematous with great toe peeling and blackened, 4th toe nearly completely black with faint odor noted. No drainage noted. PP very faint but foot is warm, cap refill to toes is slow. Pt nods her head yes to pain, no grimacing or guarding noted, pt is able to move her leg. Family member at bedside.

## 2015-12-18 NOTE — Progress Notes (Signed)
ANTICOAGULATION CONSULT NOTE - FOLLOW UP  Consult  Pharmacy Consult for heparin drip Indication: VTE treatment  No Known Allergies  Patient Measurements: Height: 5\' 5"  (165.1 cm) Weight: 172 lb (78 kg) IBW/kg (Calculated) : 57 Heparin Dosing Weight: 74 kg  Vital Signs: Temp: 98.9 F (37.2 C) (10/22 0725) Temp Source: Oral (10/22 0725) BP: 118/43 (10/22 0725) Pulse Rate: 73 (10/22 0725)  Labs:  Recent Labs  12/16/15 2354 12/17/15 0522 12/17/15 0953 12/17/15 1258 12/17/15 2037 12/18/15 0401  HGB  --  11.8*  --  13.3  --  11.9*  HCT  --  34.2*  --  39.1  --  34.7*  PLT  --  173  --  190  --  183  APTT 37*  --   --   --   --   --   LABPROT 15.7*  --   --   --   --   --   INR 1.24  --   --   --   --   --   HEPARINUNFRC  --   --  0.14*  --  0.24*  --   CREATININE  --  6.39*  --  6.83*  --  4.05*    Estimated Creatinine Clearance: 17.7 mL/min (by C-G formula based on SCr of 4.05 mg/dL (H)).   Medical History: Past Medical History:  Diagnosis Date  . Diabetes 1.5, managed as type 2 (HCC)   . ESRD on dialysis (HCC)   . Hyperlipidemia   . Hypertension   . Neuropathy (HCC)   . Renal insufficiency     Medications:  No anticoagulation in PTA meds  Assessment:  Goal of Therapy:  Heparin level 0.3-0.7 units/ml Monitor platelets by anticoagulation protocol: Yes   Plan:  Heparin level @ 0.14.   Will give Heparin bolus of 2200 units. Will increase heparin gtt to 1350 units/hr. Will recheck Heparin level @ 2000.  Heparin level resulted @ 0.24. Will increase heparin gtt to 1500 units/hr and will give heparin bolus of 1100 units x 1. Will recheck heparin level @ 2000.  Chandra Asher D 12/18/2015,11:12 AM

## 2015-12-18 NOTE — Progress Notes (Signed)
[facility] [place] [facility]   PATIENT NAME: [name]    MR#:  [id number]  DATE OF BIRTH:  [birth year]  SUBJECTIVE:  CHIEF COMPLAINT:  Patient is resting comfortably. Reports he has these left leg lesions for the past 2 months which are progressively getting worse Left leg pain is better. Denies any nausea or vomiting. Daughter and Son at bedside  REVIEW OF SYSTEMS:  CONSTITUTIONAL: No fever, fatigue or weakness.  EYES: No blurred or double vision.  EARS, NOSE, AND THROAT: No tinnitus or ear pain.  RESPIRATORY: No cough, shortness of breath, wheezing or hemoptysis.  CARDIOVASCULAR: No chest pain, orthopnea, edema.  GASTROINTESTINAL: No nausea, vomiting, diarrhea or abdominal pain.  GENITOURINARY: No dysuria, hematuria.  ENDOCRINE: No polyuria, nocturia,  HEMATOLOGY: No anemia, easy bruising or bleeding SKIN: Left leg lesions MUSCULOSKELETAL: Reporting left leg pain improvement still has swelling  No joint pain or arthritis.   NEUROLOGIC: No tingling, numbness, weakness.  PSYCHIATRY: No anxiety or depression.   DRUG ALLERGIES:  No Known Allergies  VITALS:  Blood pressure 124/62, pulse 78, temperature 97.6 F (36.4 C), temperature source Oral, resp. rate 18, height 5\' 5"  (1.651 m), weight 78 kg (172 lb), SpO2 98 %.  PHYSICAL EXAMINATION:  GENERAL:  45 y.o.-year-old patient lying in the bed with no acute distress.  EYES: Pupils equal, round, reactive to light and accommodation. No scleral icterus. Extraocular muscles intact.  HEENT: Head atraumatic, normocephalic. Oropharynx and nasopharynx clear.  NECK:  Supple, no jugular venous distention. No thyroid enlargement, no tenderness.  LUNGS: Normal breath sounds bilaterally, no wheezing, rales,rhonchi or crepitation. No use of accessory muscles of respiration.  CARDIOVASCULAR: S1, S2 normal. No murmurs, rubs, or gallops.  ABDOMEN: Soft, nontender, nondistended. Bowel sounds present. No  organomegaly or mass. Palpable left inguinal lymph node EXTREMITIES: Left lower extremity with arterial changes , discoloration , tender in the calf area , erythematous , feeble dorsalis pedis and posterior tibialis pulse  Has multiple chronic raised, nontender nonerythematous, firm to hard lesions. No  discharge noticed. Soft tissue ulceration of the left plantar aspect noticed  No pedal edema, cyanosis, or clubbing.  NEUROLOGIC: Cranial nerves II through XII are intact. Muscle strength 5/5 in all extremities. Sensation intact. Gait not checked.  PSYCHIATRIC: The patient is alert and oriented x 3.  SKIN: No obvious rash, lesion, or ulcer.    LABORATORY PANEL:   CBC  Recent Labs Lab 12/18/15 0401  WBC 9.3  HGB 11.9*  HCT 34.7*  PLT 183   ------------------------------------------------------------------------------------------------------------------  Chemistries   Recent Labs Lab 12/16/15 1900  12/18/15 0401  NA 131*  < > 136  K 4.7  < > 4.4  CL 92*  < > 97*  CO2 26  < > 28  GLUCOSE 248*  < > 166*  BUN 38*  < > 19  CREATININE 6.05*  < > 4.05*  CALCIUM 9.8  < > 9.3  AST 16  --   --   ALT 10*  --   --   ALKPHOS 139*  --   --   BILITOT 0.8  --   --   < > = values in this interval not displayed. ------------------------------------------------------------------------------------------------------------------  Cardiac Enzymes No results for input(s): TROPONINI in the last 168 hours. ------------------------------------------------------------------------------------------------------------------  RADIOLOGY:  [place] Venous Img Lower Unilateral Left  Result Date: 12/16/2015 CLINICAL DATA:  Acute onset of left leg swelling and pain. Initial encounter. EXAM: LEFT LOWER EXTREMITY VENOUS DOPPLER ULTRASOUND  TECHNIQUE: Gray-scale sonography with graded compression, as well as color Doppler and duplex ultrasound were performed to evaluate the lower extremity deep venous systems  from the level of the common femoral vein and including the common femoral, femoral, profunda femoral, popliteal and calf veins including the posterior tibial, peroneal and gastrocnemius veins when visible. The superficial great saphenous vein was also interrogated. Spectral Doppler was utilized to evaluate flow at rest and with distal augmentation maneuvers in the common femoral, femoral and popliteal veins. COMPARISON:  None. FINDINGS: Contralateral Common Femoral Vein: Respiratory phasicity is normal and symmetric with the symptomatic side. No evidence of thrombus. Normal compressibility. Common Femoral Vein: No evidence of thrombus. Normal compressibility, respiratory phasicity and response to augmentation. Saphenofemoral Junction: No evidence of thrombus. Normal compressibility and flow on color Doppler imaging. Profunda Femoral Vein: No evidence of thrombus. Normal compressibility and flow on color Doppler imaging. Femoral Vein: No evidence of thrombus. Normal compressibility, respiratory phasicity and response to augmentation. Popliteal Vein: No evidence of thrombus. Normal compressibility, respiratory phasicity and response to augmentation. Calf Veins: Occlusive thrombus is noted within within one of the patient's two left peroneal veins. Superficial Great Saphenous Vein: No evidence of thrombus. Normal compressibility and flow on color Doppler imaging. Venous Reflux:  None. Other Findings:  A 1.4 cm node is noted at the left inguinal region. IMPRESSION: 1. Occlusive deep venous thrombus noted within one of the patient's left peroneal veins. 2. 1.4 cm left inguinal node noted. These results were called by telephone at the time of interpretation on 12/16/2015 at 9:49 pm to Dr. Roxan Hockey, who verbally acknowledged these results. Electronically Signed   By: Roanna Raider M.D.   On: 12/16/2015 21:49   Dg Foot Complete Left  Result Date: 12/17/2015 CLINICAL DATA:  Patient is being seen for lower leg pain.  She has visible swelling to her foot. Visible dried sores/blisters to her calf that have opened. She is having pain for the past few months and has been ambulating with assistance. EXAM: LEFT FOOT - COMPLETE 3+ VIEW COMPARISON:  04/09/2014 FINDINGS: Bones appear radiolucent. There is significant atherosclerotic calcification of the small vessels. Diffuse soft tissue swelling noted. Lisfranc dislocation of the midfoot again noted. No radiopaque foreign body or soft tissue gas identified. No acute fracture. 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  No evidence for acute  abnormality. Electronically Signed   By: Norva Pavlov M.D.   On: 12/17/2015 20:19   Ct Extrem Lower W Cm Bil  Result Date: 12/17/2015 CLINICAL DATA:  End-stage renal disease, diabetes and neuropathy. Patient complains of left leg and foot pain with blistering seen of the left leg. Occlusive deep venous thrombosis within 1 of the patient's left peroneal veins on same day lower extremity ultrasound. EXAM: CT OF THE LOWER BILATERAL EXTREMITY WITH CONTRAST TECHNIQUE: Multidetector CT imaging of the lower bilateral extremity was performed according to the standard protocol following intravenous contrast administration. COMPARISON:  None. CONTRAST:  ISOVUE-300 IOPAMIDOL (ISOVUE-300) INJECTION 61% FINDINGS: Bones/Joint/Cartilage There is misregistration artifact at the junction of the proximal middle third of the tibia and fibula on the reformatted images not be mistaken for fracture. No acute fracture of the tibia nor fibula is seen. There is slight femorotibial joint space narrowing of the knee. The visualized patellofemoral compartment is maintained without malalignment. No intra-articular loose bodies are noted within the knee. No acute tibial or fibular fracture is noted. There is no bone destruction. The ankle joint is maintained. Tiny subcortical cyst is noted  posteriorly involving the talus. The subtalar joint is maintained. There  is disorganization, subchondral and subcortical degenerative cystic lucencies involving the midfoot articulations especially at the base of the third through fifth metatarsals and between the lateral cuneiform and cuboid. Findings have the appearance of early Charcot joint. Ligaments Suboptimally assessed by CT. Muscles and Tendons No intramuscular mass or hematoma.  Intact Achilles tendon. Soft tissues There is diffuse soft tissue edema. Plantar ulceration along the lateral aspect of the midfoot with mild soft tissue thickening. No bone destruction of the adjacent midfoot. There is extensive calcific arteriosclerosis of the leg from above knee popliteal artery through tibial trunk involving the anterior and posterior tibial arteries, perineal and dorsalis pedis arteries. IMPRESSION: Soft tissue ulceration along the plantar aspect of the midfoot without evidence of frank bone destruction. Disorganized and disrupted appearance of the midfoot articulations are more in keeping with Charcot joint. Extensive arteriosclerosis of the leg with diffuse mild soft tissue edema and induration. Electronically Signed   By: Tollie Eth M.D.   On: 12/17/2015 00:07    EKG:   Orders placed or performed during the hospital encounter of 07/12/15  . ED EKG  . ED EKG  . EKG 12-Lead  . EKG 12-Lead    ASSESSMENT AND PLAN:   46 year old Hispanic female came into the ED with a chief complaint of left lower extremity pain and swelling  # Lower limb ischemia -  Continue heparin drip  vascular surgery Considering angiogram on Tuesday   # Left leg DVT (HCC) - continue heparin drip as above     #left lower leg Cellulitis with mid plantar ulceration - IV Zosyn and vancomycin  #Left lower leg lesion-unclear etiology Blood cultures  done during dialysis discussed with Dr. Thedore Mins Outpatient follow-up with dermatology for skin biopsy is recommended  infectious disease consulted  Rapid HIV test and hepatitis B surface antigen  are negative   #  Diabetes (HCC) - sliding scale insulin with corresponding glucose checks every 6 hours for now while patient is nothing by mouth  #  HTN (hypertension) - continue home meds    # ESRD on dialysis [facility]) - nephrology consulted for dialysis     #HLD (hyperlipidemia) - continue home meds once patient starts taking by mouth  Plan of care discussed with Dr. Thedore Mins  All the records are reviewed and case discussed with Care Management/Social Workerr. Management plans discussed with the patient, son With the help of Spanish-speaking interpreter miss Damian Leavell and they are in agreement.  CODE STATUS: fc  TOTAL TIME TAKING CARE OF THIS PATIENT: 35 minutes.   POSSIBLE D/C IN 3-4 DAYS, DEPENDING ON CLINICAL CONDITION.  Note: This dictation was prepared with Dragon dictation along with smaller phrase technology. Any transcriptional errors that result from this process are unintentional.   Ramonita Lab M.D on 12/18/2015 at 12:15 PM  Between 7am to 6pm - Pager - [phone] After 6pm go to [email or web address] - password [email or web address] [facility]  [facility]  Office  [phone]  CC: Primary care physician; No PCP Per Patient

## 2015-12-18 NOTE — Consult Note (Signed)
Reason for Consult: Gangrene left forefoot Referring Physician: Hospitalist  Omar Delia Slatten is an 45 y.o. female.  HPI: The patient relates a two-month history of some increasing discoloration and swelling in her left leg. Denies any injury. Relates a history of surgery on her left foot about a year and a half ago with no problems healing. Presented to the emergency department where she was diagnosed with gangrenous changes to the toes on the left foot as well as a DVT in the left leg. Admitted for intervention and treatment.  Past Medical History:  Diagnosis Date  . Diabetes 1.5, managed as type 2 ([place])   . ESRD on dialysis ([place])   . Hyperlipidemia   . Hypertension   . Neuropathy ([place])   . Renal insufficiency     Past Surgical History:  Procedure Laterality Date  . AV FISTULA PLACEMENT    . LEG SURGERY      Family History  Problem Relation Age of Onset  . Diabetes Mellitus II Father     Social History:  reports that she has never smoked. She has never used smokeless tobacco. She reports that she does not drink alcohol or use drugs.  Allergies: No Known Allergies  Medications:  Scheduled: . insulin aspart  0-9 Units Subcutaneous TID AC & HS  . piperacillin-tazobactam (ZOSYN)  IV  3.375 g Intravenous Q12H  . sodium chloride flush  3 mL Intravenous Q12H  . vancomycin  750 mg Intravenous Q T,Th,Sa-HD    Results for orders placed or performed during the hospital encounter of 12/16/15 (from the past 48 hour(s))  Basic metabolic panel     Status: Abnormal   Collection Time: 12/16/15  7:00 PM  Result Value Ref Range   Sodium 131 (L) 135 - 145 mmol/L   Potassium 4.7 3.5 - 5.1 mmol/L   Chloride 92 (L) 101 - 111 mmol/L   CO2 26 22 - 32 mmol/L   Glucose, Bld 248 (H) 65 - 99 mg/dL   BUN 38 (H) 6 - 20 mg/dL   Creatinine, Ser 6.05 (H) 0.44 - 1.00 mg/dL   Calcium 9.8 8.9 - 10.3 mg/dL   GFR calc non Af Amer 7 (L) >60 mL/min   GFR calc Af Amer 9 (L) >60 mL/min    Comment:  (NOTE) The eGFR has been calculated using the CKD EPI equation. This calculation has not been validated in all clinical situations. eGFR's persistently <60 mL/min signify possible Chronic Kidney Disease.    Anion gap 13 5 - 15  CBC     Status: Abnormal   Collection Time: 12/16/15  7:00 PM  Result Value Ref Range   WBC 12.3 (H) 3.6 - 11.0 K/uL   RBC 4.55 3.80 - 5.20 MIL/uL   Hemoglobin 12.9 12.0 - 16.0 g/dL   HCT 39.7 35.0 - 47.0 %   MCV 87.2 80.0 - 100.0 fL   MCH 28.4 26.0 - 34.0 pg   MCHC 32.5 32.0 - 36.0 g/dL   RDW 14.7 (H) 11.5 - 14.5 %   Platelets 196 150 - 440 K/uL  Differential     Status: Abnormal   Collection Time: 12/16/15  7:00 PM  Result Value Ref Range   Neutrophils Relative % 81 %   Lymphocytes Relative 9 %   Lymphs Abs 1.1 1.0 - 3.6 K/uL   Monocytes Relative 8 %   Eosinophils Relative 1 %   Eosinophils Absolute 0.1 0 - 0.7 K/uL   Basophils Relative 1 %  Basophils Absolute 0.1 0 - 0.1 K/uL   Neutro Abs 10.0 (H) 1.4 - 6.5 K/uL    Comment: CORRECTED ON 10/20 AT 2244: PREVIOUSLY REPORTED AS 10.1   Monocytes Absolute 1.0 (H) 0.2 - 0.9 K/uL    Comment: CORRECTED ON 10/20 AT 2244: PREVIOUSLY REPORTED AS 0.9  Hepatic function panel     Status: Abnormal   Collection Time: 12/16/15  7:00 PM  Result Value Ref Range   Total Protein 8.1 6.5 - 8.1 g/dL   Albumin 3.6 3.5 - 5.0 g/dL   AST 16 15 - 41 U/L   ALT 10 (L) 14 - 54 U/L   Alkaline Phosphatase 139 (H) 38 - 126 U/L   Total Bilirubin 0.8 0.3 - 1.2 mg/dL   Bilirubin, Direct 0.2 0.1 - 0.5 mg/dL   Indirect Bilirubin 0.6 0.3 - 0.9 mg/dL  Lactic acid, plasma     Status: None   Collection Time: 12/16/15 10:21 PM  Result Value Ref Range   Lactic Acid, Venous 1.1 0.5 - 1.9 mmol/L  APTT     Status: Abnormal   Collection Time: 12/16/15 11:54 PM  Result Value Ref Range   aPTT 37 (H) 24 - 36 seconds    Comment:        IF BASELINE aPTT IS ELEVATED, SUGGEST PATIENT RISK ASSESSMENT BE USED TO DETERMINE  APPROPRIATE ANTICOAGULANT THERAPY.   Protime-INR     Status: Abnormal   Collection Time: 12/16/15 11:54 PM  Result Value Ref Range   Prothrombin Time 15.7 (H) 11.4 - 15.2 seconds   INR 1.24   Glucose, capillary     Status: Abnormal   Collection Time: 12/17/15  2:22 AM  Result Value Ref Range   Glucose-Capillary 254 (H) 65 - 99 mg/dL  MRSA PCR Screening     Status: None   Collection Time: 12/17/15  3:14 AM  Result Value Ref Range   MRSA by PCR NEGATIVE NEGATIVE    Comment:        The GeneXpert MRSA Assay (FDA approved for NASAL specimens only), is one component of a comprehensive MRSA colonization surveillance program. It is not intended to diagnose MRSA infection nor to guide or monitor treatment for MRSA infections.   Basic metabolic panel     Status: Abnormal   Collection Time: 12/17/15  5:22 AM  Result Value Ref Range   Sodium 134 (L) 135 - 145 mmol/L   Potassium 4.4 3.5 - 5.1 mmol/L   Chloride 98 (L) 101 - 111 mmol/L   CO2 23 22 - 32 mmol/L   Glucose, Bld 149 (H) 65 - 99 mg/dL   BUN 42 (H) 6 - 20 mg/dL   Creatinine, Ser 6.39 (H) 0.44 - 1.00 mg/dL   Calcium 9.3 8.9 - 10.3 mg/dL   GFR calc non Af Amer 7 (L) >60 mL/min   GFR calc Af Amer 8 (L) >60 mL/min    Comment: (NOTE) The eGFR has been calculated using the CKD EPI equation. This calculation has not been validated in all clinical situations. eGFR's persistently <60 mL/min signify possible Chronic Kidney Disease.    Anion gap 13 5 - 15  CBC     Status: Abnormal   Collection Time: 12/17/15  5:22 AM  Result Value Ref Range   WBC 9.5 3.6 - 11.0 K/uL   RBC 4.00 3.80 - 5.20 MIL/uL   Hemoglobin 11.8 (L) 12.0 - 16.0 g/dL   HCT 34.2 (L) 35.0 - 47.0 %   MCV 85.6 80.0 -  100.0 fL   MCH 29.6 26.0 - 34.0 pg   MCHC 34.6 32.0 - 36.0 g/dL   RDW 14.6 (H) 11.5 - 14.5 %   Platelets 173 150 - 440 K/uL  Glucose, capillary     Status: Abnormal   Collection Time: 12/17/15  7:58 AM  Result Value Ref Range   Glucose-Capillary  184 (H) 65 - 99 mg/dL   Comment 1 Notify RN   Heparin level (unfractionated)     Status: Abnormal   Collection Time: 12/17/15  9:53 AM  Result Value Ref Range   Heparin Unfractionated 0.14 (L) 0.30 - 0.70 IU/mL    Comment:        IF HEPARIN RESULTS ARE BELOW EXPECTED VALUES, AND PATIENT DOSAGE HAS BEEN CONFIRMED, SUGGEST FOLLOW UP TESTING OF ANTITHROMBIN III LEVELS.   Culture, blood (Routine X 2) w Reflex to ID Panel     Status: None (Preliminary result)   Collection Time: 12/17/15 12:58 PM  Result Value Ref Range   Specimen Description BLOOD RIGHT ASSIST CONTROL    Special Requests      BOTTLES DRAWN AEROBIC AND ANAEROBIC  6[facility], [place]   Culture NO GROWTH < 24 HOURS    Report Status PENDING   Renal function panel     Status: Abnormal   Collection Time: 12/17/15 12:58 PM  Result Value Ref Range   Sodium 129 (L) 135 - 145 mmol/L   Potassium 4.4 3.5 - 5.1 mmol/L   Chloride 91 (L) 101 - 111 mmol/L   CO2 23 22 - 32 mmol/L   Glucose, Bld 155 (H) 65 - 99 mg/dL   BUN 42 (H) 6 - 20 mg/dL   Creatinine, Ser 6.83 (H) 0.44 - 1.00 mg/dL   Calcium 9.6 8.9 - 10.3 mg/dL   Phosphorus 5.7 (H) 2.5 - 4.6 mg/dL   Albumin 3.6 3.5 - 5.0 g/dL   GFR calc non Af Amer 6 (L) >60 mL/min   GFR calc Af Amer 8 (L) >60 mL/min    Comment: (NOTE) The eGFR has been calculated using the CKD EPI equation. This calculation has not been validated in all clinical situations. eGFR's persistently <60 mL/min signify possible Chronic Kidney Disease.    Anion gap 15 5 - 15  CBC     Status: Abnormal   Collection Time: 12/17/15 12:58 PM  Result Value Ref Range   WBC 10.0 3.6 - 11.0 K/uL   RBC 4.51 3.80 - 5.20 MIL/uL   Hemoglobin 13.3 12.0 - 16.0 g/dL   HCT 39.1 35.0 - 47.0 %   MCV 86.7 80.0 - 100.0 fL   MCH 29.4 26.0 - 34.0 pg   MCHC 33.9 32.0 - 36.0 g/dL   RDW 14.9 (H) 11.5 - 14.5 %   Platelets 190 150 - 440 K/uL  Rapid HIV screen (HIV 1/2 Ab+Ag)     Status: None   Collection Time: 12/17/15 12:58 PM   Result Value Ref Range   HIV-1 P24 Antigen - HIV24 NON REACTIVE NON REACTIVE   HIV 1/2 Antibodies NON REACTIVE NON REACTIVE   Interpretation (HIV Ag Ab)      A non reactive test result means that HIV 1 or HIV 2 antibodies and HIV 1 p24 antigen were not detected in the specimen.  Culture, blood (Routine X 2) w Reflex to ID Panel     Status: None (Preliminary result)   Collection Time: 12/17/15  1:04 PM  Result Value Ref Range   Specimen Description BLOOD RIGHT HAND  Special Requests      BOTTLES DRAWN AEROBIC AND ANAEROBIC  3[facility]ERO, 4[facility]   Culture NO GROWTH < 24 HOURS    Report Status PENDING   Hepatitis B surface antibody     Status: None   Collection Time: 12/17/15  2:35 PM  Result Value Ref Range   Hep B S Ab Reactive     Comment: (NOTE)              Non Reactive: Inconsistent with immunity,                            less than 10 mIU/mL              Reactive:     Consistent with immunity,                            greater than 9.9 mIU/mL Performed At: [facility] [place], [place] [id number] Lindon Romp MD IR:[id number]   Hepatitis B surface antigen     Status: None   Collection Time: 12/17/15  2:35 PM  Result Value Ref Range   Hepatitis B Surface Ag Negative Negative    Comment: (NOTE) Performed At: [facility] [place], [place] [id number] Lindon Romp MD PO:[id number]   Heparin level (unfractionated)     Status: Abnormal   Collection Time: 12/17/15  8:37 PM  Result Value Ref Range   Heparin Unfractionated 0.24 (L) 0.30 - 0.70 IU/mL    Comment:        IF HEPARIN RESULTS ARE BELOW EXPECTED VALUES, AND PATIENT DOSAGE HAS BEEN CONFIRMED, SUGGEST FOLLOW UP TESTING OF ANTITHROMBIN III LEVELS.   Glucose, capillary     Status: Abnormal   Collection Time: 12/17/15  9:24 PM  Result Value Ref Range   Glucose-Capillary 289 (H) 65 - 99 mg/dL   Comment 1 Notify RN   CBC     Status: Abnormal   Collection Time:  12/18/15  4:01 AM  Result Value Ref Range   WBC 9.3 3.6 - 11.0 K/uL   RBC 4.03 3.80 - 5.20 MIL/uL   Hemoglobin 11.9 (L) 12.0 - 16.0 g/dL   HCT 34.7 (L) 35.0 - 47.0 %   MCV 86.1 80.0 - 100.0 fL   MCH 29.4 26.0 - 34.0 pg   MCHC 34.1 32.0 - 36.0 g/dL   RDW 14.9 (H) 11.5 - 14.5 %   Platelets 183 150 - 440 K/uL  Basic metabolic panel     Status: Abnormal   Collection Time: 12/18/15  4:01 AM  Result Value Ref Range   Sodium 136 135 - 145 mmol/L   Potassium 4.4 3.5 - 5.1 mmol/L   Chloride 97 (L) 101 - 111 mmol/L   CO2 28 22 - 32 mmol/L   Glucose, Bld 166 (H) 65 - 99 mg/dL   BUN 19 6 - 20 mg/dL   Creatinine, Ser 4.05 (H) 0.44 - 1.00 mg/dL   Calcium 9.3 8.9 - 10.3 mg/dL   GFR calc non Af Amer 12 (L) >60 mL/min   GFR calc Af Amer 14 (L) >60 mL/min    Comment: (NOTE) The eGFR has been calculated using the CKD EPI equation. This calculation has not been validated in all clinical situations. eGFR's persistently <60 mL/min signify possible Chronic Kidney Disease.    Anion gap 11 5 - 15  Glucose, capillary  Status: Abnormal   Collection Time: 12/18/15  7:27 AM  Result Value Ref Range   Glucose-Capillary 169 (H) 65 - 99 mg/dL   Comment 1 Notify RN   Glucose, capillary     Status: Abnormal   Collection Time: 12/18/15 11:19 AM  Result Value Ref Range   Glucose-Capillary 195 (H) 65 - 99 mg/dL   Comment 1 Notify RN     US Venous Img Lower Unilateral Left  Result Date: 12/16/2015 CLINICAL DATA:  Acute onset of left leg swelling and pain. Initial encounter. EXAM: LEFT LOWER EXTREMITY VENOUS DOPPLER ULTRASOUND TECHNIQUE: Gray-scale sonography with graded compression, as well as color Doppler and duplex ultrasound were performed to evaluate the lower extremity deep venous systems from the level of the common femoral vein and including the common femoral, femoral, profunda femoral, popliteal and calf veins including the posterior tibial, peroneal and gastrocnemius veins when visible. The  superficial great saphenous vein was also interrogated. Spectral Doppler was utilized to evaluate flow at rest and with distal augmentation maneuvers in the common femoral, femoral and popliteal veins. COMPARISON:  None. FINDINGS: Contralateral Common Femoral Vein: Respiratory phasicity is normal and symmetric with the symptomatic side. No evidence of thrombus. Normal compressibility. Common Femoral Vein: No evidence of thrombus. Normal compressibility, respiratory phasicity and response to augmentation. Saphenofemoral Junction: No evidence of thrombus. Normal compressibility and flow on color Doppler imaging. Profunda Femoral Vein: No evidence of thrombus. Normal compressibility and flow on color Doppler imaging. Femoral Vein: No evidence of thrombus. Normal compressibility, respiratory phasicity and response to augmentation. Popliteal Vein: No evidence of thrombus. Normal compressibility, respiratory phasicity and response to augmentation. Calf Veins: Occlusive thrombus is noted within within one of the patient's two left peroneal veins. Superficial Great Saphenous Vein: No evidence of thrombus. Normal compressibility and flow on color Doppler imaging. Venous Reflux:  None. Other Findings:  A 1.4 cm node is noted at the left inguinal region. IMPRESSION: 1. Occlusive deep venous thrombus noted within one of the patient's left peroneal veins. 2. 1.4 cm left inguinal node noted. These results were called by telephone at the time of interpretation on 12/16/2015 at 9:49 pm to Dr. Quentin Cornwall, who verbally acknowledged these results. Electronically Signed   By: Garald Balding M.D.   On: 12/16/2015 21:49   Dg Foot Complete Left  Result Date: 12/17/2015 CLINICAL DATA:  Patient is being seen for lower leg pain. She has visible swelling to her foot. Visible dried sores/blisters to her calf that have opened. She is having pain for the past few months and has been ambulating with assistance. EXAM: LEFT FOOT - COMPLETE 3+  VIEW COMPARISON:  04/09/2014 FINDINGS: Bones appear radiolucent. There is significant atherosclerotic calcification of the small vessels. Diffuse soft tissue swelling noted. Lisfranc dislocation of the midfoot again noted. No radiopaque foreign body or soft tissue gas identified. No acute fracture. 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  No evidence for acute  abnormality. Electronically Signed   By: Nolon Nations M.D.   On: 12/17/2015 20:19   Ct Extrem Lower W Cm Bil  Result Date: 12/17/2015 CLINICAL DATA:  End-stage renal disease, diabetes and neuropathy. Patient complains of left leg and foot pain with blistering seen of the left leg. Occlusive deep venous thrombosis within 1 of the patient's left peroneal veins on same day lower extremity ultrasound. EXAM: CT OF THE LOWER BILATERAL EXTREMITY WITH CONTRAST TECHNIQUE: Multidetector CT imaging of the lower bilateral extremity was performed according to the standard protocol following intravenous  contrast administration. COMPARISON:  None. CONTRAST:  188m ISOVUE-300 IOPAMIDOL (ISOVUE-300) INJECTION 61% FINDINGS: Bones/Joint/Cartilage There is misregistration artifact at the junction of the proximal middle third of the tibia and fibula on the reformatted images not be mistaken for fracture. No acute fracture of the tibia nor fibula is seen. There is slight femorotibial joint space narrowing of the knee. The visualized patellofemoral compartment is maintained without malalignment. No intra-articular loose bodies are noted within the knee. No acute tibial or fibular fracture is noted. There is no bone destruction. The ankle joint is maintained. Tiny subcortical cyst is noted posteriorly involving the talus. The subtalar joint is maintained. There is disorganization, subchondral and subcortical degenerative cystic lucencies involving the midfoot articulations especially at the base of the third through fifth metatarsals and between the lateral cuneiform  and cuboid. Findings have the appearance of early Charcot joint. Ligaments Suboptimally assessed by CT. Muscles and Tendons No intramuscular mass or hematoma.  Intact Achilles tendon. Soft tissues There is diffuse soft tissue edema. Plantar ulceration along the lateral aspect of the midfoot with mild soft tissue thickening. No bone destruction of the adjacent midfoot. There is extensive calcific arteriosclerosis of the leg from above knee popliteal artery through tibial trunk involving the anterior and posterior tibial arteries, perineal and dorsalis pedis arteries. IMPRESSION: Soft tissue ulceration along the plantar aspect of the midfoot without evidence of frank bone destruction. Disorganized and disrupted appearance of the midfoot articulations are more in keeping with Charcot joint. Extensive arteriosclerosis of the leg with diffuse mild soft tissue edema and induration. Electronically Signed   By: DAshley RoyaltyM.D.   On: 12/17/2015 00:07    Review of Systems  Constitutional: Negative for chills and fever.  HENT: Negative.   Eyes: Negative.   Respiratory: Negative.   Cardiovascular: Positive for leg swelling.       In the left leg  Gastrointestinal: Negative for nausea and vomiting.  Genitourinary: Negative.   Musculoskeletal: Negative.   Skin:       Swelling in the left leg discoloration in the toes  Neurological:       Some numbness and paresthesias in the feet due to her diabetes  Endo/Heme/Allergies: Negative.   Psychiatric/Behavioral: Negative.    Blood pressure 124/62, pulse 78, temperature 97.6 F (36.4 C), temperature source Oral, resp. rate 18, height 5' 5"  (1.651 m), weight 78 kg (172 lb), SpO2 98 %. Physical Exam  Cardiovascular:  DP and PT pulses are nonpalpable on the left. Trace on the right.  Musculoskeletal:  Stiff range of motion in the left foot with obvious Charcot deformity in the left foot, nonacute. Muscle testing deferred  Neurological:  Loss of protective  threshold with a monofilament bilateral. Proprioception impaired  Skin:  The skin is thin dry and atrophic. Significant edema with discoloration in the left lower extremity to the knee as compared to the right. Black gangrenous changes are noted to the left for which are dry with some gangrenous changes also noted at the distal tip of the left hallux. The remaining toes are somewhat dusky in appearance.    Assessment/Plan: Assessment: 1. Gangrene left forefoot. 2. Peripheral vascular disease. 3. Diabetes with associated neuropathy.  Plan: Discussed with the patient through an interpreter that there is no need for any action on the part of podiatry until after she has her vascular intervention. Discussed with the patient that pending the results of that intervention will determine whether she has to have toes amputated or a transmetatarsal amputation  versus a more proximal amputation. A gauze was applied between the toes to separate them and keep the skin from rubbing. For now we will await vascular intervention which at this point is planned for Tuesday.  Durward Fortes 12/18/2015, 12:56 PM

## 2015-12-18 NOTE — Progress Notes (Signed)
heaprin bolus given at this time of 1100 units, verified by Ana,Rn, and heparin drip changed to 1500 untis/hr per pharmacy dosing.

## 2015-12-18 NOTE — Progress Notes (Signed)
Dr. Amado CoeGouru has rounded on pt with interpreter. Dr. Effie BerkshireExplained to pt the need for her to see a dermatologist regarding the firm lesions scattered on her L leg, a few on her  r leg as well.

## 2015-12-19 LAB — CBC
HCT: 33.3 % — ABNORMAL LOW (ref 35.0–47.0)
Hemoglobin: 11.6 g/dL — ABNORMAL LOW (ref 12.0–16.0)
MCH: 29.9 pg (ref 26.0–34.0)
MCHC: 34.8 g/dL (ref 32.0–36.0)
MCV: 85.9 fL (ref 80.0–100.0)
PLATELETS: 184 10*3/uL (ref 150–440)
RBC: 3.88 MIL/uL (ref 3.80–5.20)
RDW: 14.6 % — AB (ref 11.5–14.5)
WBC: 9.9 10*3/uL (ref 3.6–11.0)

## 2015-12-19 LAB — GLUCOSE, CAPILLARY
GLUCOSE-CAPILLARY: 202 mg/dL — AB (ref 65–99)
GLUCOSE-CAPILLARY: 249 mg/dL — AB (ref 65–99)
GLUCOSE-CAPILLARY: 210 mg/dL — AB (ref 65–99)
Glucose-Capillary: 232 mg/dL — ABNORMAL HIGH (ref 65–99)
Glucose-Capillary: 246 mg/dL — ABNORMAL HIGH (ref 65–99)

## 2015-12-19 LAB — HEPARIN LEVEL (UNFRACTIONATED)
HEPARIN UNFRACTIONATED: 0.28 [IU]/mL — AB (ref 0.30–0.70)
Heparin Unfractionated: 0.53 IU/mL (ref 0.30–0.70)
Heparin Unfractionated: 0.43 IU/mL (ref 0.30–0.70)

## 2015-12-19 MED ORDER — INSULIN DETEMIR 100 UNIT/ML ~~LOC~~ SOLN
8.0000 [IU] | Freq: Every day | SUBCUTANEOUS | Status: DC
  Administered 2015-12-19 – 2015-12-24 (×4): 8 [IU] via SUBCUTANEOUS
  Filled 2015-12-19 (×7): qty 0.08

## 2015-12-19 MED ORDER — HEPARIN BOLUS VIA INFUSION
1100.0000 [IU] | Freq: Once | INTRAVENOUS | Status: AC
  Administered 2015-12-19: 1100 [IU] via INTRAVENOUS
  Filled 2015-12-19: qty 1100

## 2015-12-19 NOTE — Progress Notes (Signed)
ANTICOAGULATION CONSULT NOTE - FOLLOW UP  Consult  Pharmacy Consult for heparin drip Indication: VTE treatment  No Known Allergies  Patient Measurements: Height: 5\' 5"  (165.1 cm) Weight: 169 lb 12.8 oz (77 kg) IBW/kg (Calculated) : 57 Heparin Dosing Weight: 74 kg  Vital Signs: Temp: 98.4 F (36.9 C) (10/23 0733) Temp Source: Oral (10/23 0733) BP: 150/54 (10/23 0733) Pulse Rate: 75 (10/23 0733)  Labs:  Recent Labs  12/16/15 2354 12/17/15 0522  12/17/15 1258 12/17/15 2037 12/18/15 0401 12/19/15 0023 12/19/15 1011  HGB  --  11.8*  --  13.3  --  11.9* 11.6*  --   HCT  --  34.2*  --  39.1  --  34.7* 33.3*  --   PLT  --  173  --  190  --  183 184  --   APTT 37*  --   --   --   --   --   --   --   LABPROT 15.7*  --   --   --   --   --   --   --   INR 1.24  --   --   --   --   --   --   --   HEPARINUNFRC  --   --   < >  --  0.24*  --  0.28* 0.53  CREATININE  --  6.39*  --  6.83*  --  4.05*  --   --   < > = values in this interval not displayed.  Estimated Creatinine Clearance: 17.6 mL/min (by C-G formula based on SCr of 4.05 mg/dL (H)).   Medical History: Past Medical History:  Diagnosis Date  . Diabetes 1.5, managed as type 2 ([facility])   . ESRD on dialysis ([facility])   . Hyperlipidemia   . Hypertension   . Neuropathy ([facility])   . Renal insufficiency     Medications:  No anticoagulation in PTA meds  Assessment:  Goal of Therapy:  Heparin level 0.3-0.7 units/ml Monitor platelets by anticoagulation protocol: Yes   Plan:  Heparin level @ 0.14.   Will give Heparin bolus of 2200 units. Will increase heparin gtt to 1350 units/hr. Will recheck Heparin level @ 2000.  Heparin level resulted @ 0.24. Will increase heparin gtt to 1500 units/hr and will give heparin bolus of 1100 units x 1. Will recheck heparin level @ 2000.   10/23 00:30 heparin level 0.28. 2200 unit bolus and increase rate to 1650 units/hr. Recheck level in 8 hours.  10/23  HL @ 1011= 0.53. Will continue  current drip rate and check confirmatory level in 8 hours at 1800.   [name] A 12/19/2015,11:06 AM

## 2015-12-19 NOTE — Progress Notes (Signed)
Subjective:   History taken with assistance of Spanish Interpreter.  Family at bedside.   Patient states she is starting to feel better.   Objective:  Vital signs in last 24 hours:  Temp:  [97.6 F (36.4 C)-98.9 F (37.2 C)] 98.4 F (36.9 C) (10/23 0733) Pulse Rate:  [73-80] 75 (10/23 0733) Resp:  [16-18] 16 (10/23 0733) BP: (97-150)/(54-66) 150/54 (10/23 0733) SpO2:  [95 %-100 %] 98 % (10/23 0733) Weight:  [77 kg (169 lb 12.8 oz)] 77 kg (169 lb 12.8 oz) (10/23 0500)  Weight change: -4.679 kg (-10 lb 5.1 oz) Filed Weights   12/17/15 1818 12/18/15 0501 12/19/15 0500  Weight: 79.2 kg (174 lb 9.7 oz) 78 kg (172 lb) 77 kg (169 lb 12.8 oz)    Intake/Output:    Intake/Output Summary (Last 24 hours) at 12/19/15 1207 Last data filed at 12/19/15 1034  Gross per 24 hour  Intake           442.75 ml  Output                0 ml  Net           442.75 ml     Physical Exam: General: No acute distress laying in the bed  HEENT Anicteric, moist oral mucous membranes  Neck supple  Pulm/lungs Normal breathing effort, clear to auscultation  CVS/Heart regular, no rub or gallop  Abdomen:  Soft, nontender, nondistended  Extremities: No pedal edema  Neurologic: Alert and oriented  Skin: hyperkeratosis skin lesions ranging up to 2 cm, on left lower extremity  Gangreneous toes of left foot  Access: Left upper extremity AV graft.  Good bruit and thrill       Basic Metabolic Panel:   Recent Labs Lab 12/16/15 1900 12/17/15 0522 12/17/15 1258 12/18/15 0401  NA 131* 134* 129* 136  K 4.7 4.4 4.4 4.4  CL 92* 98* 91* 97*  CO2 26 23 23 28   GLUCOSE 248* 149* 155* 166*  BUN 38* 42* 42* 19  CREATININE 6.05* 6.39* 6.83* 4.05*  CALCIUM 9.8 9.3 9.6 9.3  PHOS  --   --  5.7*  --      CBC:  Recent Labs Lab 12/16/15 1900 12/17/15 0522 12/17/15 1258 12/18/15 0401 12/19/15 0023  WBC 12.3* 9.5 10.0 9.3 9.9  NEUTROABS 10.0*  --   --   --   --   HGB 12.9 11.8* 13.3 11.9* 11.6*   HCT 39.7 34.2* 39.1 34.7* 33.3*  MCV 87.2 85.6 86.7 86.1 85.9  PLT 196 173 190 183 184      Microbiology:  Recent Results (from the past 720 hour(s))  MRSA PCR Screening     Status: None   Collection Time: 12/17/15  3:14 AM  Result Value Ref Range Status   MRSA by PCR NEGATIVE NEGATIVE Final    Comment:        The GeneXpert MRSA Assay (FDA approved for NASAL specimens only), is one component of a comprehensive MRSA colonization surveillance program. It is not intended to diagnose MRSA infection nor to guide or monitor treatment for MRSA infections.   Culture, blood (Routine X 2) w Reflex to ID Panel     Status: None (Preliminary result)   Collection Time: 12/17/15 12:58 PM  Result Value Ref Range Status   Specimen Description BLOOD RIGHT ASSIST CONTROL  Final   Special Requests   Final    BOTTLES DRAWN AEROBIC AND ANAEROBIC  6CCAERO, 7CCANA   Culture  NO GROWTH 2 DAYS  Final   Report Status PENDING  Incomplete  Culture, blood (Routine X 2) w Reflex to ID Panel     Status: None (Preliminary result)   Collection Time: 12/17/15  1:04 PM  Result Value Ref Range Status   Specimen Description BLOOD RIGHT HAND  Final   Special Requests   Final    BOTTLES DRAWN AEROBIC AND ANAEROBIC  3CCAERO, 4CCANA   Culture NO GROWTH 2 DAYS  Final   Report Status PENDING  Incomplete    Coagulation Studies:  Recent Labs  12/16/15 2354  LABPROT 15.7*  INR 1.24    Urinalysis: No results for input(s): COLORURINE, LABSPEC, PHURINE, GLUCOSEU, HGBUR, BILIRUBINUR, KETONESUR, PROTEINUR, UROBILINOGEN, NITRITE, LEUKOCYTESUR in the last 72 hours.  Invalid input(s): APPERANCEUR    Imaging: Dg Foot Complete Left  Result Date: 12/17/2015 CLINICAL DATA:  Patient is being seen for lower leg pain. She has visible swelling to her foot. Visible dried sores/blisters to her calf that have opened. She is having pain for the past few months and has been ambulating with assistance. EXAM: LEFT FOOT -  COMPLETE 3+ VIEW COMPARISON:  04/09/2014 FINDINGS: Bones appear radiolucent. There is significant atherosclerotic calcification of the small vessels. Diffuse soft tissue swelling noted. Lisfranc dislocation of the midfoot again noted. No radiopaque foreign body or soft tissue gas identified. No acute fracture. 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  No evidence for acute  abnormality. Electronically Signed   By: Norva Pavlov M.D.   On: 12/17/2015 20:19     Medications:   . heparin 1,650 Units/hr (12/19/15 0417)   . insulin aspart  0-9 Units Subcutaneous TID AC & HS  . piperacillin-tazobactam (ZOSYN)  IV  3.375 g Intravenous Q12H  . sodium chloride flush  3 mL Intravenous Q12H  . vancomycin  750 mg Intravenous Q T,Th,Sa-HD   acetaminophen **OR** acetaminophen, fentaNYL (SUBLIMAZE) injection, heparin, lidocaine-prilocaine, ondansetron **OR** ondansetron (ZOFRAN) IV, oxyCODONE  Assessment/ Plan:  45 y.o. Hispanic female with end-stage renal disease on hemodialysis, diabetes mellitus type II with nephropathy, peripheral arterial disease, Charcot joint, gangrene of left toes, presents for Leg swelling [M79.89] Lower limb ischemia [I99.8] Cellulitis of toe of left foot [L03.032] Deep vein thrombosis (DVT) of popliteal vein of left lower extremity, unspecified chronicity (HCC) [I82.432]  TTS [facility].   1. End Stage Renal Disease: next dialysis treatment scheduled for tomorrow Continue TTS schedule.  AVF  2. Anemia of chronic kidney disease: hemoglobin 11.6 - hold epo due to current ischemia  3.  Secondary hyperparathyroidism: not currently on binders. Calcium at goal.   4.  Left leg DVT  - heparin gtt  5. Peripheral vascular disease - appreciate vascular input and podiatry input.  - IV vancomycin and pip/tazo  6. Diabetes Mellitus type II with chronic kidney disease - continue glucose control.    LOS: 2 [name] 10/23/201712:07 PM

## 2015-12-19 NOTE — Progress Notes (Deleted)
Dr Deeann SaintHoward Miller notified that pt expired at 0418.

## 2015-12-19 NOTE — Progress Notes (Signed)
Inpatient Diabetes Program Recommendations  AACE/ADA: New Consensus Statement on Inpatient Glycemic Control (2015)  Target Ranges:  Prepandial:   less than 140 mg/dL      Peak postprandial:   less than 180 mg/dL (1-2 hours)      Critically ill patients:  140 - 180 mg/dL    Results for [name], [name] (MRN [id number]) as of 12/19/2015 12:32  Ref. Range 12/19/2015 07:29 12/19/2015 11:43  Glucose-Capillary Latest Ref Range: 65 - 99 mg/dL 284232 (H) 132246 (H)    Admit LE Ischemia/ Cellulitis/ Gangrene.   History: DM, ESRD  Home DM Meds: None listed but other records in the chart state NPH 10 units BID  Current Insulin Orders: Novolog Sensitive Correction Scale/ SSI (0-9 units) TID AC + HS       MD- Please consider starting low dose basal insulin for this patient in-hospital:  Recommend Levemir 8 units daily (0.1 units/kg dosing based on weight of 77kg)      --Will follow patient during hospitalization--  Ambrose FinlandJeannine Johnston Gery Sabedra RN, MSN, CDE Diabetes Coordinator Inpatient Glycemic Control Team Team Pager: [phone] (8a-5p)

## 2015-12-19 NOTE — Consult Note (Signed)
[facility] Infectious Disease     Reason for Consult:  l foot gangrene  Referring Physician:  Gouru, A Date of Admission:  12/16/2015   Principal Problem:   Lower limb ischemia Active Problems:   Left leg DVT (HCC)   Diabetes ([place])   HTN (hypertension)   HLD (hyperlipidemia)   ESRD on dialysis ([place])   Cellulitis   HPI: [name] is a 45 y.o. female with DM with PN, ESRD on HD admit with l foot pain and found to have gangrene L 3rd 4th toes and DVT L peroneal. She also had multiple LLE skin lesions present for several months.  Seen today with interpreter - Ronnald Collum.  She has had several weeks of progressive changes to her toes beginning as blisters and progressing. The skin lesions have been present for months but seem to be improving.  She does scratch at them. From [place] but not there in over 5 years.  Past Medical History:  Diagnosis Date  . Diabetes 1.5, managed as type 2 ([place])   . ESRD on dialysis ([place])   . Hyperlipidemia   . Hypertension   . Neuropathy ([place])   . Renal insufficiency    Past Surgical History:  Procedure Laterality Date  . AV FISTULA PLACEMENT    . LEG SURGERY     Social History  Substance Use Topics  . Smoking status: Never Smoker  . Smokeless tobacco: Never Used  . Alcohol use No   Family History  Problem Relation Age of Onset  . Diabetes Mellitus II Father     Allergies: No Known Allergies  Current antibiotics: Antibiotics Given (last 72 hours)    Date/Time Action Medication Dose Rate   12/17/15 0316 Given   vancomycin (VANCOCIN) 1,500 mg in sodium chloride 0.9 % 500 mL IVPB 1,500 mg 250 mL/hr   12/17/15 0432 Given   piperacillin-tazobactam (ZOSYN) IVPB 3.375 g 3.375 g 12.5 mL/hr   12/17/15 1706 Given  [Dialysis]   vancomycin (VANCOCIN) IVPB 750 mg/150 ml premix 750 mg 150 mL/hr   12/17/15 1851 Given   piperacillin-tazobactam (ZOSYN) IVPB 3.375 g 3.375 g 12.5 mL/hr   12/18/15 0543 Given   piperacillin-tazobactam (ZOSYN) IVPB  3.375 g 3.375 g 12.5 mL/hr   12/18/15 1614 Given   piperacillin-tazobactam (ZOSYN) IVPB 3.375 g 3.375 g 12.5 mL/hr   12/19/15 0416 Given   piperacillin-tazobactam (ZOSYN) IVPB 3.375 g 3.375 g 12.5 mL/hr      MEDICATIONS: . insulin aspart  0-9 Units Subcutaneous TID AC & HS  . piperacillin-tazobactam (ZOSYN)  IV  3.375 g Intravenous Q12H  . sodium chloride flush  3 mL Intravenous Q12H  . vancomycin  750 mg Intravenous Q T,Th,Sa-HD    Review of Systems - 11 systems reviewed and negative per HPI   OBJECTIVE: Temp:  [97.6 F (36.4 C)-98.9 F (37.2 C)] 98.8 F (37.1 C) (10/23 1217) Pulse Rate:  [73-83] 83 (10/23 1217) Resp:  [16] 16 (10/23 0733) BP: (111-150)/(54-64) 138/62 (10/23 1217) SpO2:  [95 %-100 %] 96 % (10/23 1217) Weight:  [77 kg (169 lb 12.8 oz)] 77 kg (169 lb 12.8 oz) (10/23 0500) Physical Exam  Constitutional:  Chronically ill appearing  HENT: [place]/AT, PERRLA, no scleral icterus Mouth/Throat: Oropharynx is clear and moist. No oropharyngeal exudate.  Cardiovascular: Normal rate, LUE avf with good thrill Pulmonary/Chest: Effort normal and breath sounds normal.  Neck = supple, no nuchal rigidity Abdominal: Soft. Bowel sounds are normal.  exhibits no distension. There is no tenderness.  Lymphadenopathy: no cervical adenopathy. No axillary adenopathy Neurological: alert and oriented to person, place, and time.  Skin: L leg with impressive foul smelling gangrene of multiple toes and purplish discoloration of the remaining toes Foot is cool Ant and post shin and calf with multiple raised hyperkeratotic lesions   LABS: Results for orders placed or performed during the hospital encounter of 12/16/15 (from the past 48 hour(s))  Heparin level (unfractionated)     Status: Abnormal   Collection Time: 12/17/15  8:37 PM  Result Value Ref Range   Heparin Unfractionated 0.24 (L) 0.30 - 0.70 IU/mL    Comment:        IF HEPARIN RESULTS ARE BELOW EXPECTED VALUES, AND  PATIENT DOSAGE HAS BEEN CONFIRMED, SUGGEST FOLLOW UP TESTING OF ANTITHROMBIN III LEVELS.   Glucose, capillary     Status: Abnormal   Collection Time: 12/17/15  9:24 PM  Result Value Ref Range   Glucose-Capillary 289 (H) 65 - 99 mg/dL   Comment 1 Notify RN   CBC     Status: Abnormal   Collection Time: 12/18/15  4:01 AM  Result Value Ref Range   WBC 9.3 3.6 - 11.0 K/uL   RBC 4.03 3.80 - 5.20 MIL/uL   Hemoglobin 11.9 (L) 12.0 - 16.0 g/dL   HCT 34.7 (L) 35.0 - 47.0 %   MCV 86.1 80.0 - 100.0 fL   MCH 29.4 26.0 - 34.0 pg   MCHC 34.1 32.0 - 36.0 g/dL   RDW 14.9 (H) 11.5 - 14.5 %   Platelets 183 150 - 440 K/uL  Basic metabolic panel     Status: Abnormal   Collection Time: 12/18/15  4:01 AM  Result Value Ref Range   Sodium 136 135 - 145 mmol/L   Potassium 4.4 3.5 - 5.1 mmol/L   Chloride 97 (L) 101 - 111 mmol/L   CO2 28 22 - 32 mmol/L   Glucose, Bld 166 (H) 65 - 99 mg/dL   BUN 19 6 - 20 mg/dL   Creatinine, Ser 4.05 (H) 0.44 - 1.00 mg/dL   Calcium 9.3 8.9 - 10.3 mg/dL   GFR calc non Af Amer 12 (L) >60 mL/min   GFR calc Af Amer 14 (L) >60 mL/min    Comment: (NOTE) The eGFR has been calculated using the CKD EPI equation. This calculation has not been validated in all clinical situations. eGFR's persistently <60 mL/min signify possible Chronic Kidney Disease.    Anion gap 11 5 - 15  Glucose, capillary     Status: Abnormal   Collection Time: 12/18/15  7:27 AM  Result Value Ref Range   Glucose-Capillary 169 (H) 65 - 99 mg/dL   Comment 1 Notify RN   Glucose, capillary     Status: Abnormal   Collection Time: 12/18/15 11:19 AM  Result Value Ref Range   Glucose-Capillary 195 (H) 65 - 99 mg/dL   Comment 1 Notify RN   Glucose, capillary     Status: Abnormal   Collection Time: 12/18/15  4:51 PM  Result Value Ref Range   Glucose-Capillary 272 (H) 65 - 99 mg/dL   Comment 1 Notify RN   Glucose, capillary     Status: Abnormal   Collection Time: 12/18/15  9:31 PM  Result Value Ref  Range   Glucose-Capillary 132 (H) 65 - 99 mg/dL   Comment 1 Notify RN   CBC     Status: Abnormal   Collection Time: 12/19/15 12:23 AM  Result Value Ref Range  WBC 9.9 3.6 - 11.0 K/uL   RBC 3.88 3.80 - 5.20 MIL/uL   Hemoglobin 11.6 (L) 12.0 - 16.0 g/dL   HCT 33.3 (L) 35.0 - 47.0 %   MCV 85.9 80.0 - 100.0 fL   MCH 29.9 26.0 - 34.0 pg   MCHC 34.8 32.0 - 36.0 g/dL   RDW 14.6 (H) 11.5 - 14.5 %   Platelets 184 150 - 440 K/uL  Heparin level (unfractionated)     Status: Abnormal   Collection Time: 12/19/15 12:23 AM  Result Value Ref Range   Heparin Unfractionated 0.28 (L) 0.30 - 0.70 IU/mL    Comment:        IF HEPARIN RESULTS ARE BELOW EXPECTED VALUES, AND PATIENT DOSAGE HAS BEEN CONFIRMED, SUGGEST FOLLOW UP TESTING OF ANTITHROMBIN III LEVELS.   Glucose, capillary     Status: Abnormal   Collection Time: 12/19/15  4:23 AM  Result Value Ref Range   Glucose-Capillary 202 (H) 65 - 99 mg/dL  Glucose, capillary     Status: Abnormal   Collection Time: 12/19/15  7:29 AM  Result Value Ref Range   Glucose-Capillary 232 (H) 65 - 99 mg/dL  Heparin level (unfractionated)     Status: None   Collection Time: 12/19/15 10:11 AM  Result Value Ref Range   Heparin Unfractionated 0.53 0.30 - 0.70 IU/mL    Comment:        IF HEPARIN RESULTS ARE BELOW EXPECTED VALUES, AND PATIENT DOSAGE HAS BEEN CONFIRMED, SUGGEST FOLLOW UP TESTING OF ANTITHROMBIN III LEVELS.   Glucose, capillary     Status: Abnormal   Collection Time: 12/19/15 11:43 AM  Result Value Ref Range   Glucose-Capillary 246 (H) 65 - 99 mg/dL   No components found for: ESR, C REACTIVE PROTEIN MICRO: Recent Results (from the past 720 hour(s))  MRSA PCR Screening     Status: None   Collection Time: 12/17/15  3:14 AM  Result Value Ref Range Status   MRSA by PCR NEGATIVE NEGATIVE Final    Comment:        The GeneXpert MRSA Assay (FDA approved for NASAL specimens only), is one component of a comprehensive MRSA  colonization surveillance program. It is not intended to diagnose MRSA infection nor to guide or monitor treatment for MRSA infections.   Culture, blood (Routine X 2) w Reflex to ID Panel     Status: None (Preliminary result)   Collection Time: 12/17/15 12:58 PM  Result Value Ref Range Status   Specimen Description BLOOD RIGHT ASSIST CONTROL  Final   Special Requests   Final    BOTTLES DRAWN AEROBIC AND ANAEROBIC  6CCAERO, [place]   Culture NO GROWTH 2 DAYS  Final   Report Status PENDING  Incomplete  Culture, blood (Routine X 2) w Reflex to ID Panel     Status: None (Preliminary result)   Collection Time: 12/17/15  1:04 PM  Result Value Ref Range Status   Specimen Description BLOOD RIGHT HAND  Final   Special Requests   Final    BOTTLES DRAWN AEROBIC AND ANAEROBIC  3CCAERO, 4CCANA   Culture NO GROWTH 2 DAYS  Final   Report Status PENDING  Incomplete    IMAGING: US Venous Img Lower Unilateral Left  Result Date: 12/16/2015 CLINICAL DATA:  Acute onset of left leg swelling and pain. Initial encounter. EXAM: LEFT LOWER EXTREMITY VENOUS DOPPLER ULTRASOUND TECHNIQUE: Gray-scale sonography with graded compression, as well as color Doppler and duplex ultrasound were performed to evaluate the lower  extremity deep venous systems from the level of the common femoral vein and including the common femoral, femoral, profunda femoral, popliteal and calf veins including the posterior tibial, peroneal and gastrocnemius veins when visible. The superficial great saphenous vein was also interrogated. Spectral Doppler was utilized to evaluate flow at rest and with distal augmentation maneuvers in the common femoral, femoral and popliteal veins. COMPARISON:  None. FINDINGS: Contralateral Common Femoral Vein: Respiratory phasicity is normal and symmetric with the symptomatic side. No evidence of thrombus. Normal compressibility. Common Femoral Vein: No evidence of thrombus. Normal compressibility, respiratory  phasicity and response to augmentation. Saphenofemoral Junction: No evidence of thrombus. Normal compressibility and flow on color Doppler imaging. Profunda Femoral Vein: No evidence of thrombus. Normal compressibility and flow on color Doppler imaging. Femoral Vein: No evidence of thrombus. Normal compressibility, respiratory phasicity and response to augmentation. Popliteal Vein: No evidence of thrombus. Normal compressibility, respiratory phasicity and response to augmentation. Calf Veins: Occlusive thrombus is noted within within one of the patient's two left peroneal veins. Superficial Great Saphenous Vein: No evidence of thrombus. Normal compressibility and flow on color Doppler imaging. Venous Reflux:  None. Other Findings:  A 1.4 cm node is noted at the left inguinal region. IMPRESSION: 1. Occlusive deep venous thrombus noted within one of the patient's left peroneal veins. 2. 1.4 cm left inguinal node noted. These results were called by telephone at the time of interpretation on 12/16/2015 at 9:49 pm to Dr. Quentin Cornwall, who verbally acknowledged these results. Electronically Signed   By: Garald Balding M.D.   On: 12/16/2015 21:49   Dg Foot Complete Left  Result Date: 12/17/2015 CLINICAL DATA:  Patient is being seen for lower leg pain. She has visible swelling to her foot. Visible dried sores/blisters to her calf that have opened. She is having pain for the past few months and has been ambulating with assistance. EXAM: LEFT FOOT - COMPLETE 3+ VIEW COMPARISON:  04/09/2014 FINDINGS: Bones appear radiolucent. There is significant atherosclerotic calcification of the small vessels. Diffuse soft tissue swelling noted. Lisfranc dislocation of the midfoot again noted. No radiopaque foreign body or soft tissue gas identified. No acute fracture. 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  No evidence for acute  abnormality. Electronically Signed   By: Nolon Nations M.D.   On: 12/17/2015 20:19   Ct Extrem  Lower W Cm Bil  Result Date: 12/17/2015 CLINICAL DATA:  End-stage renal disease, diabetes and neuropathy. Patient complains of left leg and foot pain with blistering seen of the left leg. Occlusive deep venous thrombosis within 1 of the patient's left peroneal veins on same day lower extremity ultrasound. EXAM: CT OF THE LOWER BILATERAL EXTREMITY WITH CONTRAST TECHNIQUE: Multidetector CT imaging of the lower bilateral extremity was performed according to the standard protocol following intravenous contrast administration. COMPARISON:  None. CONTRAST:  130m ISOVUE-300 IOPAMIDOL (ISOVUE-300) INJECTION 61% FINDINGS: Bones/Joint/Cartilage There is misregistration artifact at the junction of the proximal middle third of the tibia and fibula on the reformatted images not be mistaken for fracture. No acute fracture of the tibia nor fibula is seen. There is slight femorotibial joint space narrowing of the knee. The visualized patellofemoral compartment is maintained without malalignment. No intra-articular loose bodies are noted within the knee. No acute tibial or fibular fracture is noted. There is no bone destruction. The ankle joint is maintained. Tiny subcortical cyst is noted posteriorly involving the talus. The subtalar joint is maintained. There is disorganization, subchondral and subcortical degenerative cystic lucencies involving the  midfoot articulations especially at the base of the third through fifth metatarsals and between the lateral cuneiform and cuboid. Findings have the appearance of early Charcot joint. Ligaments Suboptimally assessed by CT. Muscles and Tendons No intramuscular mass or hematoma.  Intact Achilles tendon. Soft tissues There is diffuse soft tissue edema. Plantar ulceration along the lateral aspect of the midfoot with mild soft tissue thickening. No bone destruction of the adjacent midfoot. There is extensive calcific arteriosclerosis of the leg from above knee popliteal artery through  tibial trunk involving the anterior and posterior tibial arteries, perineal and dorsalis pedis arteries. IMPRESSION: Soft tissue ulceration along the plantar aspect of the midfoot without evidence of frank bone destruction. Disorganized and disrupted appearance of the midfoot articulations are more in keeping with Charcot joint. Extensive arteriosclerosis of the leg with diffuse mild soft tissue edema and induration. Electronically Signed   By: Ashley Royalty M.D.   On: 12/17/2015 00:07    Assessment:   Brooklynn Brandenburg is a 45 y.o. female with DM, ESRD, PAD admitted with gangrene LLE and skin lesions on ant shin and calf. Has impressive gangrene.  Has been AF wbc down to 9 from12. [place] neg. For vascular angiogram and will likely need partial foot amputation.  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  Recommendations Cont vanco and zosyn  Proceed with revascularization and surgery as needed. Will liley need several weeks antibiotic therapy.  If skin lesions do not heal with revascularization would suggest bxp  Thank you very much for allowing me to participate in the care of this patient. Please call with questions.   Cheral Marker. Ola Spurr, MD

## 2015-12-19 NOTE — Progress Notes (Signed)
ANTICOAGULATION CONSULT NOTE - FOLLOW UP  Consult  Pharmacy Consult for heparin drip Indication: VTE treatment  No Known Allergies  Patient Measurements: Height: 5\' 5"  (165.1 cm) Weight: 169 lb 12.8 oz (77 kg) IBW/kg (Calculated) : 57 Heparin Dosing Weight: 74 kg  Vital Signs: Temp: 98.7 F (37.1 C) (10/23 1616) Temp Source: Oral (10/23 1616) BP: 168/84 (10/23 1616) Pulse Rate: 98 (10/23 1616)  Labs:  Recent Labs  12/16/15 2354 12/17/15 0522  12/17/15 1258  12/18/15 0401 12/19/15 0023 12/19/15 1011 12/19/15 1749  HGB  --  11.8*  --  13.3  --  11.9* 11.6*  --   --   HCT  --  34.2*  --  39.1  --  34.7* 33.3*  --   --   PLT  --  173  --  190  --  183 184  --   --   APTT 37*  --   --   --   --   --   --   --   --   LABPROT 15.7*  --   --   --   --   --   --   --   --   INR 1.24  --   --   --   --   --   --   --   --   HEPARINUNFRC  --   --   < >  --   < >  --  0.28* 0.53 0.43  CREATININE  --  6.39*  --  6.83*  --  4.05*  --   --   --   < > = values in this interval not displayed.  Estimated Creatinine Clearance: 17.6 mL/min (by C-G formula based on SCr of 4.05 mg/dL (H)).   Medical History: Past Medical History:  Diagnosis Date  . Diabetes 1.5, managed as type 2 (HCC)   . ESRD on dialysis ([facility])   . Hyperlipidemia   . Hypertension   . Neuropathy (HCC)   . Renal insufficiency     Medications:  No anticoagulation in PTA meds  Assessment:  Goal of Therapy:  Heparin level 0.3-0.7 units/ml Monitor platelets by anticoagulation protocol: Yes   Plan:  Heparin level @ 0.14.   Will give Heparin bolus of 2200 units. Will increase heparin gtt to 1350 units/hr. Will recheck Heparin level @ 2000.  Heparin level resulted @ 0.24. Will increase heparin gtt to 1500 units/hr and will give heparin bolus of 1100 units x 1. Will recheck heparin level @ 2000.   10/23 00:30 heparin level 0.28. 2200 unit bolus and increase rate to 1650 units/hr. Recheck level in 8  hours.  10/23  HL @ 1011= 0.53. Will continue current drip rate and check confirmatory level in 8 hours at 1800.  10/23 HL @ 1830= 0.43. Second level with in range. Will continue to monitor HL with AM labs.    Jomaira Darr M Shron Ozer 12/19/2015,6:30 PM

## 2015-12-19 NOTE — Progress Notes (Signed)
ANTICOAGULATION CONSULT NOTE - FOLLOW UP  Consult  Pharmacy Consult for heparin drip Indication: VTE treatment  No Known Allergies  Patient Measurements: Height: 5\' 5"  (165.1 cm) Weight: 172 lb (78 kg) IBW/kg (Calculated) : 57 Heparin Dosing Weight: 74 kg  Vital Signs: Temp: 98.9 F (37.2 C) (10/22 2349) Temp Source: Oral (10/22 2349) BP: 111/56 (10/22 2349) Pulse Rate: 73 (10/22 2349)  Labs:  Recent Labs  12/16/15 2354 12/17/15 0522 12/17/15 0953 12/17/15 1258 12/17/15 2037 12/18/15 0401 12/19/15 0023  HGB  --  11.8*  --  13.3  --  11.9* 11.6*  HCT  --  34.2*  --  39.1  --  34.7* 33.3*  PLT  --  173  --  190  --  183 184  APTT 37*  --   --   --   --   --   --   LABPROT 15.7*  --   --   --   --   --   --   INR 1.24  --   --   --   --   --   --   HEPARINUNFRC  --   --  0.14*  --  0.24*  --  0.28*  CREATININE  --  6.39*  --  6.83*  --  4.05*  --     Estimated Creatinine Clearance: 17.7 mL/min (by C-G formula based on SCr of 4.05 mg/dL (H)).   Medical History: Past Medical History:  Diagnosis Date  . Diabetes 1.5, managed as type 2 (HCC)   . ESRD on dialysis (HCC)   . Hyperlipidemia   . Hypertension   . Neuropathy (HCC)   . Renal insufficiency     Medications:  No anticoagulation in PTA meds  Assessment:  Goal of Therapy:  Heparin level 0.3-0.7 units/ml Monitor platelets by anticoagulation protocol: Yes   Plan:  Heparin level @ 0.14.   Will give Heparin bolus of 2200 units. Will increase heparin gtt to 1350 units/hr. Will recheck Heparin level @ 2000.  Heparin level resulted @ 0.24. Will increase heparin gtt to 1500 units/hr and will give heparin bolus of 1100 units x 1. Will recheck heparin level @ 2000.   10/23 00:30 heparin level 0.28. 2200 unit bolus and increase rate to 1650 units/hr. Recheck level in 8 hours.   Tynan Boesel S 12/19/2015,1:42 AM

## 2015-12-19 NOTE — Progress Notes (Signed)
[facility] [place] [facility]   PATIENT NAME: Stephanie LatinoSonia Reyes Curtis    MR#:  [id number]  DATE OF BIRTH:  Jan 29, 1971  SUBJECTIVE:  CHIEF COMPLAINT:  Patient is resting comfortably. Left leg pain is better. Denies any nausea or vomiting. Daughter and Son at bedside  REVIEW OF SYSTEMS:  CONSTITUTIONAL: No fever, fatigue or weakness.  EYES: No blurred or double vision.  EARS, NOSE, AND THROAT: No tinnitus or ear pain.  RESPIRATORY: No cough, shortness of breath, wheezing or hemoptysis.  CARDIOVASCULAR: No chest pain, orthopnea, edema.  GASTROINTESTINAL: No nausea, vomiting, diarrhea or abdominal pain.  GENITOURINARY: No dysuria, hematuria.  ENDOCRINE: No polyuria, nocturia,  HEMATOLOGY: No anemia, easy bruising or bleeding SKIN: Left leg lesions MUSCULOSKELETAL: Reporting left leg pain improvement still has swelling  No joint pain or arthritis.   NEUROLOGIC: No tingling, numbness, weakness.  PSYCHIATRY: No anxiety or depression.   DRUG ALLERGIES:  No Known Allergies  VITALS:  Blood pressure 138/62, pulse 83, temperature 98.8 F (37.1 C), temperature source Oral, resp. rate 16, height 5\' 5"  (1.651 m), weight 77 kg (169 lb 12.8 oz), SpO2 96 %.  PHYSICAL EXAMINATION:  GENERAL:  45 y.o.-year-old patient lying in the bed with no acute distress.  EYES: Pupils equal, round, reactive to light and accommodation. No scleral icterus. Extraocular muscles intact.  HEENT: Head atraumatic, normocephalic. Oropharynx and nasopharynx clear.  NECK:  Supple, no jugular venous distention. No thyroid enlargement, no tenderness.  LUNGS: Normal breath sounds bilaterally, no wheezing, rales,rhonchi or crepitation. No use of accessory muscles of respiration.  CARDIOVASCULAR: S1, S2 normal. No murmurs, rubs, or gallops.  ABDOMEN: Soft, nontender, nondistended. Bowel sounds present. No organomegaly or mass. Palpable left inguinal lymph node EXTREMITIES: Left lower extremity with  arterial changes , discoloration , tender in the calf area , erythematous , feeble dorsalis pedis and posterior tibialis pulse  Has multiple chronic raised, nontender nonerythematous, firm to hard lesions. No  discharge noticed. Soft tissue ulceration of the left plantar aspect noticed  No pedal edema, cyanosis, or clubbing.  NEUROLOGIC: Cranial nerves II through XII are intact. Muscle strength 5/5 in all extremities. Sensation intact. Gait not checked.  PSYCHIATRIC: The patient is alert and oriented x 3.  SKIN: No obvious rash, lesion, or ulcer.    LABORATORY PANEL:   CBC  Recent Labs Lab 12/19/15 0023  WBC 9.9  HGB 11.6*  HCT 33.3*  PLT 184   ------------------------------------------------------------------------------------------------------------------  Chemistries   Recent Labs Lab 12/16/15 1900  12/18/15 0401  NA 131*  < > 136  K 4.7  < > 4.4  CL 92*  < > 97*  CO2 26  < > 28  GLUCOSE 248*  < > 166*  BUN 38*  < > 19  CREATININE 6.05*  < > 4.05*  CALCIUM 9.8  < > 9.3  AST 16  --   --   ALT 10*  --   --   ALKPHOS 139*  --   --   BILITOT 0.8  --   --   < > = values in this interval not displayed. ------------------------------------------------------------------------------------------------------------------  Cardiac Enzymes No results for input(s): TROPONINI in the last 168 hours. ------------------------------------------------------------------------------------------------------------------  RADIOLOGY:  Dg Foot Complete Left  Result Date: 12/17/2015 CLINICAL DATA:  Patient is being seen for lower leg pain. She has visible swelling to her foot. Visible dried sores/blisters to her calf that have opened. She is having pain for the past few months and  has been ambulating with assistance. EXAM: LEFT FOOT - COMPLETE 3+ VIEW COMPARISON:  04/09/2014 FINDINGS: Bones appear radiolucent. There is significant atherosclerotic calcification of the small vessels. Diffuse  soft tissue swelling noted. Lisfranc dislocation of the midfoot again noted. No radiopaque foreign body or soft tissue gas identified. No acute fracture. 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  No evidence for acute  abnormality. Electronically Signed   By: Norva Pavlov M.D.   On: 12/17/2015 20:19    EKG:   Orders placed or performed during the hospital encounter of 07/12/15  . ED EKG  . ED EKG  . EKG 12-Lead  . EKG 12-Lead    ASSESSMENT AND PLAN:   45 year old Hispanic female came into the ED with a chief complaint of left lower extremity pain and swelling  # Lower limb ischemia -  Continue heparin drip  vascular surgery Considering angiogram on Tuesday pending the results of that intervention will determine whether she has to have toes amputated or a transmetatarsal amputation versus a more proximal amputation , appreciate Podiatry recommendations   # Left leg DVT (HCC) - continue heparin drip as above     #left lower leg Cellulitis with mid plantar ulceration - IV Zosyn and vancomycin  #Left lower leg lesion-unclear etiology Blood cultures  done during dialysis discussed with Dr. Wynelle Link Outpatient follow-up with dermatology for skin biopsy is recommended  infectious disease consulted  Rapid HIV test and hepatitis B surface antigen are negative   #  Diabetes (HCC) - sliding scale insulin with corresponding glucose checks every 6 hours for now while patient is nothing by mouth  #  HTN (hypertension) - continue home meds    # ESRD on dialysis [facility]) -  dialysis per nephrology appreciate nephrology recommendations     #HLD (hyperlipidemia) - continue home meds once patient starts taking by mouth  Plan of care discussed with Dr. Wynelle Link  All the records are reviewed and case discussed with Care Management/Social Workerr. Management plans discussed with the patient, son With the help of Spanish-speaking interpreter Orson Slick and they are in agreement.  CODE STATUS:  fc  TOTAL TIME TAKING CARE OF THIS PATIENT: 35 minutes.   POSSIBLE D/C IN 3-4 DAYS, DEPENDING ON CLINICAL CONDITION.  Note: This dictation was prepared with Dragon dictation along with smaller phrase technology. Any transcriptional errors that result from this process are unintentional.   Ramonita Lab M.D on 12/19/2015 at 4:19 PM  Between 7am to 6pm - Pager - [phone] After 6pm go to [email or web address] - password [email or web address] [facility]  [place] [facility]  Office  [phone]  CC: Primary care physician; No PCP Per Patient

## 2015-12-20 ENCOUNTER — Encounter
Admission: EM | Disposition: A | Discharge status: Home / Self Care | Payer: Self-pay | Source: Home / Self Care | Attending: Internal Medicine

## 2015-12-20 DIAGNOSIS — I70248 Atherosclerosis of native arteries of left leg with ulceration of other part of lower left leg: Secondary | ICD-10-CM

## 2015-12-20 HISTORY — PX: PERIPHERAL VASCULAR CATHETERIZATION: SHX172C

## 2015-12-20 LAB — CBC WITH DIFFERENTIAL/PLATELET
BASOS ABS: 0.1 10*3/uL (ref 0–0.1)
BASOS PCT: 1 %
EOS PCT: 2 %
Eosinophils Absolute: 0.3 10*3/uL (ref 0–0.7)
HCT: 35.5 % (ref 35.0–47.0)
Hemoglobin: 11.6 g/dL — ABNORMAL LOW (ref 12.0–16.0)
Lymphocytes Relative: 14 %
Lymphs Abs: 1.7 10*3/uL (ref 1.0–3.6)
MCH: 28.8 pg (ref 26.0–34.0)
MCHC: 32.7 g/dL (ref 32.0–36.0)
MCV: 88 fL (ref 80.0–100.0)
MONO ABS: 1 10*3/uL — AB (ref 0.2–0.9)
Monocytes Relative: 8 %
Neutro Abs: 9.4 10*3/uL — ABNORMAL HIGH (ref 1.4–6.5)
Neutrophils Relative %: 75 %
PLATELETS: 179 10*3/uL (ref 150–440)
RBC: 4.03 MIL/uL (ref 3.80–5.20)
RDW: 14.8 % — AB (ref 11.5–14.5)
WBC: 12.5 10*3/uL — ABNORMAL HIGH (ref 3.6–11.0)

## 2015-12-20 LAB — GLUCOSE, CAPILLARY
GLUCOSE-CAPILLARY: 134 mg/dL — AB (ref 65–99)
GLUCOSE-CAPILLARY: 107 mg/dL — AB (ref 65–99)
Glucose-Capillary: 122 mg/dL — ABNORMAL HIGH (ref 65–99)
Glucose-Capillary: 102 mg/dL — ABNORMAL HIGH (ref 65–99)
Glucose-Capillary: 120 mg/dL — ABNORMAL HIGH (ref 65–99)

## 2015-12-20 LAB — BASIC METABOLIC PANEL
Anion gap: 14 (ref 5–15)
BUN: 37 mg/dL — AB (ref 6–20)
CALCIUM: 9.8 mg/dL (ref 8.9–10.3)
CO2: 26 mmol/L (ref 22–32)
CREATININE: 6.76 mg/dL — AB (ref 0.44–1.00)
Chloride: 90 mmol/L — ABNORMAL LOW (ref 101–111)
GFR calc Af Amer: 8 mL/min — ABNORMAL LOW (ref >60)
GFR, EST NON AFRICAN AMERICAN: 7 mL/min — AB (ref >60)
GLUCOSE: 130 mg/dL — AB (ref 65–99)
Potassium: 5.3 mmol/L — ABNORMAL HIGH (ref 3.5–5.1)
Sodium: 130 mmol/L — ABNORMAL LOW (ref 135–145)

## 2015-12-20 LAB — HEPARIN LEVEL (UNFRACTIONATED): Heparin Unfractionated: 0.39 IU/mL (ref 0.30–0.70)

## 2015-12-20 SURGERY — LOWER EXTREMITY ANGIOGRAPHY
Anesthesia: Moderate Sedation | Laterality: Left

## 2015-12-20 MED ORDER — HEPARIN SODIUM (PORCINE) 1000 UNIT/ML IJ SOLN
INTRAMUSCULAR | Status: DC | PRN
  Administered 2015-12-20: 1000 [IU] via INTRAVENOUS
  Administered 2015-12-20: 4000 [IU] via INTRAVENOUS

## 2015-12-20 MED ORDER — FENTANYL CITRATE (PF) 100 MCG/2ML IJ SOLN
INTRAMUSCULAR | Status: AC
  Filled 2015-12-20: qty 2

## 2015-12-20 MED ORDER — HEPARIN (PORCINE) IN NACL 2-0.9 UNIT/ML-% IJ SOLN
INTRAMUSCULAR | Status: AC
  Filled 2015-12-20: qty 1000

## 2015-12-20 MED ORDER — CLOPIDOGREL BISULFATE 75 MG PO TABS
ORAL_TABLET | ORAL | Status: AC
  Filled 2015-12-20: qty 2

## 2015-12-20 MED ORDER — LIDOCAINE HCL (PF) 1 % IJ SOLN
INTRAMUSCULAR | Status: AC
  Filled 2015-12-20: qty 30

## 2015-12-20 MED ORDER — MIDAZOLAM HCL 2 MG/2ML IJ SOLN
INTRAMUSCULAR | Status: DC | PRN
  Administered 2015-12-20 (×4): 0.5 mg via INTRAVENOUS
  Administered 2015-12-20: 1 mg via INTRAVENOUS
  Administered 2015-12-20: 2 mg via INTRAVENOUS

## 2015-12-20 MED ORDER — DEXTROSE 50 % IV SOLN
INTRAVENOUS | Status: AC
  Administered 2015-12-20: 50 mL
  Filled 2015-12-20: qty 50

## 2015-12-20 MED ORDER — HEPARIN (PORCINE) IN NACL 100-0.45 UNIT/ML-% IJ SOLN
INTRAMUSCULAR | Status: AC
  Administered 2015-12-20: 13:00:00
  Filled 2015-12-20: qty 250

## 2015-12-20 MED ORDER — MIDAZOLAM HCL 5 MG/5ML IJ SOLN
INTRAMUSCULAR | Status: AC
  Filled 2015-12-20: qty 5

## 2015-12-20 MED ORDER — HYDROMORPHONE HCL 1 MG/ML IJ SOLN: 1.0000 mg | Freq: Once | INTRAMUSCULAR | Status: DC

## 2015-12-20 MED ORDER — CLOPIDOGREL BISULFATE 75 MG PO TABS
150.0000 mg | ORAL_TABLET | Freq: Every day | ORAL | Status: DC
  Administered 2015-12-20 – 2015-12-21 (×2): 150 mg via ORAL
  Filled 2015-12-20: qty 2

## 2015-12-20 MED ORDER — FENTANYL CITRATE (PF) 100 MCG/2ML IJ SOLN
INTRAMUSCULAR | Status: DC | PRN
  Administered 2015-12-20: 12.5 ug via INTRAVENOUS
  Administered 2015-12-20 (×5): 25 ug via INTRAVENOUS
  Administered 2015-12-20: 50 ug via INTRAVENOUS
  Administered 2015-12-20: 12.5 ug via INTRAVENOUS

## 2015-12-20 MED ORDER — IOPAMIDOL (ISOVUE-300) INJECTION 61%
INTRAVENOUS | Status: DC | PRN
Start: 2015-12-20 — End: 2015-12-20
  Administered 2015-12-20: 85 mL via INTRA_ARTERIAL

## 2015-12-20 MED ORDER — HEPARIN SODIUM (PORCINE) 1000 UNIT/ML IJ SOLN
INTRAMUSCULAR | Status: AC
  Filled 2015-12-20: qty 1

## 2015-12-20 MED ORDER — HYDROMORPHONE HCL 1 MG/ML IJ SOLN
INTRAMUSCULAR | Status: AC
  Filled 2015-12-20: qty 1

## 2015-12-20 MED ORDER — MORPHINE SULFATE (PF) 2 MG/ML IV SOLN
2.0000 mg | INTRAVENOUS | Status: DC | PRN
  Administered 2015-12-20: 2 mg via INTRAVENOUS
  Administered 2015-12-20: 1 mg via INTRAVENOUS
  Administered 2015-12-21: 2 mg via INTRAVENOUS
  Filled 2015-12-20 (×3): qty 1

## 2015-12-20 SURGICAL SUPPLY — 32 items
BALLN DORADO 4X200X135 (BALLOONS) ×4
BALLN LUTONIX 4X150X130 (BALLOONS) ×4
BALLN LUTONIX DCB 4X60X130 (BALLOONS) ×4
BALLN LUTONIX DCB 5X40X130 (BALLOONS) ×4
BALLN ULTRVRSE 4X100X130C (BALLOONS) ×4
BALLN ULTRVRSE 4X100X75C (BALLOONS) ×4
BALLOON DORADO 4X200X135 (BALLOONS) ×2 IMPLANT
BALLOON LUTONIX 4X150X130 (BALLOONS) ×2 IMPLANT
BALLOON LUTONIX DCB 4X60X130 (BALLOONS) ×2 IMPLANT
BALLOON LUTONIX DCB 5X40X130 (BALLOONS) ×2 IMPLANT
BALLOON ULTRVRSE 4X100X130C (BALLOONS) ×2 IMPLANT
BALLOON ULTRVRSE 4X100X75C (BALLOONS) ×2 IMPLANT
CATH CROSSER 14S OTW 146CM (CATHETERS) ×4 IMPLANT
CATH CXI SUPP ANG 4FR 135 (MICROCATHETER) ×2 IMPLANT
CATH CXI SUPP ANG 4FR 135CM (MICROCATHETER) ×4
CATH PIG 70CM (CATHETERS) ×4 IMPLANT
CATH SIDEKICK XL ST 110CM (SHEATH) ×4 IMPLANT
DEVICE PRESTO INFLATION (MISCELLANEOUS) ×4 IMPLANT
DEVICE STARCLOSE SE CLOSURE (Vascular Products) ×4 IMPLANT
DEVICE TORQUE (MISCELLANEOUS) ×4 IMPLANT
GLIDECATH ANGLED 4FR 120CM (CATHETERS) ×4 IMPLANT
GLIDEWIRE ADV .035X260CM (WIRE) ×4 IMPLANT
GLIDEWIRE ANGLED SS 035X260CM (WIRE) ×4 IMPLANT
PACK ANGIOGRAPHY (CUSTOM PROCEDURE TRAY) ×4 IMPLANT
SET INTRO CAPELLA COAXIAL (SET/KITS/TRAYS/PACK) ×4 IMPLANT
SHEATH BRITE TIP 5FRX11 (SHEATH) ×4 IMPLANT
SHEATH RAABE 7FR (SHEATH) ×4 IMPLANT
STENT TIGRIS 5X100X120 (Permanent Stent) ×4 IMPLANT
SYR MEDRAD MARK V 150ML (SYRINGE) ×4 IMPLANT
TUBING CONTRAST HIGH PRESS 72 (TUBING) ×4 IMPLANT
WIRE HI TORQ VERSACORE 300 (WIRE) ×4 IMPLANT
WIRE J 3MM .035X145CM (WIRE) ×4 IMPLANT

## 2015-12-20 NOTE — Op Note (Signed)
[facility] Percutaneous Study/Intervention Procedural Note   Date of Surgery: 12/20/2015  Surgeon:  Katha Cabal, MD.  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 Introduction catheter into left lower extremity 3rd order catheter placement  2. Contrast injection left lower extremity for distal runoff   3. Crosser atherectomy of the left SFA and popliteal arteries 4.  Percutaneous transluminal angioplasty and stent placement left superficial femoral artery and popliteal             5.   Star close closure right common femoral arteriotomy  Anesthesia: Conscious sedation was administered under my direct supervision. IV Versed plus fentanyl were utilized. Continuous ECG, pulse oximetry and blood pressure was monitored throughout the entire procedure. Conscious sedation was for a total of 85 minutes.  Sheath: 7 French Rabi right common femoral retrograde  Contrast: 85 cc  Fluoroscopy Time: 20.8 minutes  Indications: Margo Aye presents with ulceration gangrene of the left foot. She has known palpable pedal pulses. CT with contrast was done demonstrating significant atherosclerotic occlusive disease distally. The risks and benefits are reviewed all questions answered patient agrees to proceed.  Procedure: Nobie Alleyne is a 45 y.o. y.o. female who was identified and appropriate procedural time out was performed. The patient was then placed supine on the table and prepped and draped in the usual sterile fashion.   Ultrasound was placed in the sterile sleeve and the right groin was evaluated the right common femoral artery was echolucent and pulsatile indicating patency.  Image was  recorded for the permanent record and under real-time visualization a microneedle was inserted into the common femoral artery microwire followed by a micro-sheath.  A J-wire was then advanced through the micro-sheath and a  5 [place] sheath was then inserted over a J-wire. J-wire was then advanced and a 5 French pigtail catheter was positioned at the level of T12. AP projection of the aorta was then obtained. Pigtail catheter was repositioned to above the bifurcation and a RAO view of the pelvis was obtained.  Subsequently a pigtail catheter with the stiff angle Glidewire was used to cross the aortic bifurcation the catheter wire were advanced down into the left distal external iliac artery. Oblique view of the femoral bifurcation was then obtained and subsequently the wire was reintroduced and the pigtail catheter negotiated into the SFA representing third order catheter placement. Distal runoff was then performed. Imaging demonstrated occlusion of the SFA at Hunter's canal indicating the CT angiogram was inadequate as this was reported is patent and therefore further distal runoff is performed.  5000 units of heparin was then given and allowed to circulate and a 7 [place] Rabi sheath was advanced up and over the bifurcation and positioned in the femoral artery  The 14* Crosser catheter was then prepped on the field and a straight micro- catheter was advanced into the cul-de-sac of the SFA under magnified imaging in the LAO projection. Using the Crosser catheter the occlusion was negotiated and hand injection of contrast was used to verify intraluminal placement distally.  CXI  catheter and stiff angle Glidewire were then negotiated down into the distal popliteal.  Distal runoff was then completed by hand injection through the catheter.    A 4 x 200 [place] balloon was used to angioplasty the superficial femoral and popliteal arteries. Inflation  was to 16 atmospheres for 2 minutes. Follow-up imaging demonstrated  patency with adequate preparation of the vessel for a drug-coated balloon.  Subsequently, a 4 x 15 and then a 4 x 6 Lutonix balloon was utilized inflating to 12 atm for 2 full minutes. Follow-up imaging demonstrated greater than 50% residual stenosis in the distal SFA and proximal popliteal and therefore a 5 x 100 Tigris stent was deployed and subsequently postdilated with a 4 x 100 all traverse and subsequently a 5 x 4 Lutonix balloon. Distal runoff was then reassessed.  After review of these images the sheath is pulled into the right external iliac oblique of the common femoral is obtained and a Star close device deployed. There no immediate Complications.  Findings: The abdominal aorta is opacified with a bolus injection contrast. Renal arteries are nonvisualized consistent with end-stage renal disease. The aorta itself has diffuse disease but no hemodynamically significant lesions. The common and external iliac arteries are widely patent bilaterally.  The left common femoral is widely patent as is the profunda femoris, although the profunda femoris demonstrates very poor collateral vessels.  The SFA does indeed have a occlusion at Hunter's canal which extends through the proximal one half of the popliteal.  The distal popliteal demonstrates calcific disease which is non-flow-limiting and the trifurcation is patent there is three-vessel runoff to the foot with the dominant tibial being the anterior tibial. Dorsalis pedis is patent and fills the pedal arch lateral plantar is diffusely diseased and does not contribute significantly to pedal blood flow.  Following angioplasty and stent placement the SFA and popliteal are now widely patent distal runoff is preserved.    Disposition: Patient was taken to the recovery room in stable condition having tolerated the procedure well.  Belenda Cruise Evaline Waltman 12/20/2015,11:51 AM

## 2015-12-20 NOTE — H&P (Signed)
[place] [facility] History & Physical Update  The patient was interviewed and re-examined.  The patient's previous History and Physical has been reviewed and is unchanged.  There is no change in the plan of care. We plan to proceed with the scheduled procedure.  Levora DredgeGregory Schnier, MD  12/20/2015, 8:59 AM

## 2015-12-20 NOTE — Care Management (Signed)
Barrier to discharge: Angiogram today to lower limb.

## 2015-12-20 NOTE — Progress Notes (Signed)
[facility] - [place] at [facility]   PATIENT NAME: [name]    MR#:  [id number]  DATE OF BIRTH:  Apr 24, 1970  SUBJECTIVE:  CHIEF COMPLAINT:  Patient Was seen postoperatively, denies any pain. Doesn't feel comfortable to get dialysis today   REVIEW OF SYSTEMS:  CONSTITUTIONAL: No fever, fatigue or weakness.  EYES: No blurred or double vision.  EARS, NOSE, AND THROAT: No tinnitus or ear pain.  RESPIRATORY: No cough, shortness of breath, wheezing or hemoptysis.  CARDIOVASCULAR: No chest pain, orthopnea, edema.  GASTROINTESTINAL: No nausea, vomiting, diarrhea or abdominal pain.  GENITOURINARY: No dysuria, hematuria.  ENDOCRINE: No polyuria, nocturia,  HEMATOLOGY: No anemia, easy bruising or bleeding SKIN: Left leg lesions MUSCULOSKELETAL: Reporting left leg pain improvement still has swelling  No joint pain or arthritis.   NEUROLOGIC: No tingling, numbness, weakness.  PSYCHIATRY: No anxiety or depression.   DRUG ALLERGIES:  No Known Allergies  VITALS:  Blood pressure (!) 177/80, pulse 88, temperature 97.4 F (36.3 C), temperature source Oral, resp. rate 18, height 5\' 5"  (1.651 m), weight 77.3 kg (170 lb 6.7 oz), SpO2 100 %.  PHYSICAL EXAMINATION:  GENERAL:  45 y.o.-year-old patient lying in the bed with no acute distress.  EYES: Pupils equal, round, reactive to light and accommodation. No scleral icterus. Extraocular muscles intact.  HEENT: Head atraumatic, normocephalic. Oropharynx and nasopharynx clear.  NECK:  Supple, no jugular venous distention. No thyroid enlargement, no tenderness.  LUNGS: Normal breath sounds bilaterally, no wheezing, rales,rhonchi or crepitation. No use of accessory muscles of respiration.  CARDIOVASCULAR: S1, S2 normal. No murmurs, rubs, or gallops.  ABDOMEN: Soft, nontender, nondistended. Bowel sounds present. No organomegaly or mass. Palpable left inguinal lymph node EXTREMITIES: Left lower extremity with arterial  changes , discoloration , tender in the calf area , erythematous , feeble dorsalis pedis and posterior tibialis pulse  Has multiple chronic raised, nontender nonerythematous, firm to hard lesions. No  discharge noticed. Soft tissue ulceration of the left plantar aspect noticed  No pedal edema, cyanosis, or clubbing.  NEUROLOGIC: Cranial nerves II through XII are intact. Muscle strength 5/5 in all extremities. Sensation intact. Gait not checked.  PSYCHIATRIC: The patient is alert and oriented x 3.  SKIN: No obvious rash, lesion, or ulcer.    LABORATORY PANEL:   CBC  Recent Labs Lab 12/20/15 0334  WBC 12.5*  HGB 11.6*  HCT 35.5  PLT 179   ------------------------------------------------------------------------------------------------------------------  Chemistries   Recent Labs Lab 12/16/15 1900  12/20/15 0334  NA 131*  < > 130*  K 4.7  < > 5.3*  CL 92*  < > 90*  CO2 26  < > 26  GLUCOSE 248*  < > 130*  BUN 38*  < > 37*  CREATININE 6.05*  < > 6.76*  CALCIUM 9.8  < > 9.8  AST 16  --   --   ALT 10*  --   --   ALKPHOS 139*  --   --   BILITOT 0.8  --   --   < > = values in this interval not displayed. ------------------------------------------------------------------------------------------------------------------  Cardiac Enzymes No results for input(s): TROPONINI in the last 168 hours. ------------------------------------------------------------------------------------------------------------------  RADIOLOGY:  No results found.  EKG:   Orders placed or performed during the hospital encounter of 07/12/15  . ED EKG  . ED EKG  . EKG 12-Lead  . EKG 12-Lead    ASSESSMENT AND PLAN:   45 year old Hispanic female came into the ED  with a chief complaint of left lower extremity pain and swelling  # Lower limb ischemia - pod#0 Patient had Crosser atherectomy of the left SFA and popliteal arteries,Percutaneous transluminal angioplasty and stent placement left  superficial femoral artery and popliteal Tolerated the procedure well Continue heparin drip, patient was given today's dose of Plavix in PACU  Appreciate vascular surgery recommendations Podiatry  will determine whether she has to have toes amputated or a transmetatarsal amputation versus a more proximal amputation,  appreciate Podiatry recommendations   # Left leg DVT (HCC) - continue heparin drip as above     #left lower leg Cellulitis with mid plantar ulceration - IV Zosyn and vancomycin  #Left lower leg lesion-unclear etiology Blood cultures  done during dialysis discussed with Dr. Wynelle Linkkolluru Outpatient follow-up with dermatology for skin biopsy is recommended  infectious disease consulted  Rapid HIV test and hepatitis B surface antigen are negative   #  Diabetes (HCC) - sliding scale insulin with corresponding glucose checks every 6 hours   #  HTN (hypertension) - continue home meds    # ESRD on dialysis [facility](HCC) -  dialysis per nephrology appreciate nephrology recommendations     #HLD (hyperlipidemia) - continue home meds once patient starts taking by mouth  Plan of care discussed with Dr. Wynelle Linkkolluru  All the records are reviewed and case discussed with Care Management/Social Workerr. Management plans discussed with the patient With the help of Spanish-speaking interpreter [name] and they are in agreement.  CODE STATUS: fc  TOTAL TIME TAKING CARE OF THIS PATIENT: 35 minutes.   POSSIBLE D/C IN 3-4 DAYS, DEPENDING ON CLINICAL CONDITION.  Note: This dictation was prepared with Dragon dictation along with smaller phrase technology. Any transcriptional errors that result from this process are unintentional.   Ramonita LabGouru, Gearldene Fiorenza M.D on 12/20/2015 at 1:30 PM  Between 7am to 6pm - Pager - [phone] After 6pm go to [email or web address] - password [email or web address] [facility]  [facility]  Office  [phone]  CC: Primary care physician; No PCP Per Patient

## 2015-12-20 NOTE — Progress Notes (Signed)
ID E note Has angiogram.    Rec cont current abx Await demarcation and podiatry decision regarding amputation

## 2015-12-20 NOTE — Progress Notes (Signed)
Pt clinically stable post procedure, vitals stable, restarting heparin gtt back at original rate per Dr Gilda CreaseSchnier. Given po plavix dose in recovery per orders, received dilaudid iv as in recovery for pain, interpreter here upon arrival to recovery to answer questions , Dr Gilda CreaseSchnier out to see pt. Spoke with daughter on phone and answered questions regarding procedure. Report called to care nurse with plan reviewed, questions answered

## 2015-12-20 NOTE — Progress Notes (Signed)
Subjective:   Patient had angiogram this morning with Dr. Gilda CreaseSchnier. Arthrectomy performed.   Patient refusing hemodialysis today. She states she has enough for today.   Objective:  Vital signs in last 24 hours:  Temp:  [97.4 F (36.3 C)-98.7 F (37.1 C)] 98.2 F (36.8 C) (10/24 1506) Pulse Rate:  [72-98] 98 (10/24 1506) Resp:  [12-20] 18 (10/24 1506) BP: (150-177)/(52-96) 163/52 (10/24 1506) SpO2:  [93 %-100 %] 93 % (10/24 1506) Weight:  [77.3 kg (170 lb 6.7 oz)] 77.3 kg (170 lb 6.7 oz) (10/24 0500)  Weight change: 0.279 kg (9.9 oz) Filed Weights   12/18/15 0501 12/19/15 0500 12/20/15 0500  Weight: 78 kg (172 lb) 77 kg (169 lb 12.8 oz) 77.3 kg (170 lb 6.7 oz)    Intake/Output:    Intake/Output Summary (Last 24 hours) at 12/20/15 1648 Last data filed at 12/20/15 0700  Gross per 24 hour  Intake           687.98 ml  Output                0 ml  Net           687.98 ml     Physical Exam: General: No acute distress laying in the bed  HEENT Anicteric, moist oral mucous membranes  Neck supple  Pulm/lungs Normal breathing effort, clear to auscultation  CVS/Heart regular, no rub or gallop  Abdomen:  Soft, nontender, nondistended  Extremities: No pedal edema  Neurologic: Alert and oriented  Skin: hyperkeratosis skin lesions ranging up to 2 cm, on left lower extremity  Gangreneous toes of left foot  Access: Left upper extremity AV graft.  Good bruit and thrill       Basic Metabolic Panel:   Recent Labs Lab 12/16/15 1900 12/17/15 0522 12/17/15 1258 12/18/15 0401 12/20/15 0334  NA 131* 134* 129* 136 130*  K 4.7 4.4 4.4 4.4 5.3*  CL 92* 98* 91* 97* 90*  CO2 26 23 23 28 26   GLUCOSE 248* 149* 155* 166* 130*  BUN 38* 42* 42* 19 37*  CREATININE 6.05* 6.39* 6.83* 4.05* 6.76*  CALCIUM 9.8 9.3 9.6 9.3 9.8  PHOS  --   --  5.7*  --   --      CBC:  Recent Labs Lab 12/16/15 1900 12/17/15 0522 12/17/15 1258 12/18/15 0401 12/19/15 0023 12/20/15 0334  WBC 12.3*  9.5 10.0 9.3 9.9 12.5*  NEUTROABS 10.0*  --   --   --   --  9.4*  HGB 12.9 11.8* 13.3 11.9* 11.6* 11.6*  HCT 39.7 34.2* 39.1 34.7* 33.3* 35.5  MCV 87.2 85.6 86.7 86.1 85.9 88.0  PLT 196 173 190 183 184 179      Microbiology:  Recent Results (from the past 720 hour(s))  MRSA PCR Screening     Status: None   Collection Time: 12/17/15  3:14 AM  Result Value Ref Range Status   MRSA by PCR NEGATIVE NEGATIVE Final    Comment:        The GeneXpert MRSA Assay (FDA approved for NASAL specimens only), is one component of a comprehensive MRSA colonization surveillance program. It is not intended to diagnose MRSA infection nor to guide or monitor treatment for MRSA infections.   Culture, blood (Routine X 2) w Reflex to ID Panel     Status: None (Preliminary result)   Collection Time: 12/17/15 12:58 PM  Result Value Ref Range Status   Specimen Description BLOOD RIGHT ASSIST CONTROL  Final  Special Requests   Final    BOTTLES DRAWN AEROBIC AND ANAEROBIC  6CCAERO, 7CCANA   Culture NO GROWTH 3 DAYS  Final   Report Status PENDING  Incomplete  Culture, blood (Routine X 2) w Reflex to ID Panel     Status: None (Preliminary result)   Collection Time: 12/17/15  1:04 PM  Result Value Ref Range Status   Specimen Description BLOOD RIGHT HAND  Final   Special Requests   Final    BOTTLES DRAWN AEROBIC AND ANAEROBIC  3CCAERO, 4CCANA   Culture NO GROWTH 3 DAYS  Final   Report Status PENDING  Incomplete    Coagulation Studies: No results for input(s): LABPROT, INR in the last 72 hours.  Urinalysis: No results for input(s): COLORURINE, LABSPEC, PHURINE, GLUCOSEU, HGBUR, BILIRUBINUR, KETONESUR, PROTEINUR, UROBILINOGEN, NITRITE, LEUKOCYTESUR in the last 72 hours.  Invalid input(s): APPERANCEUR    Imaging: No results found.   Medications:   . heparin 1,650 Units/hr (12/20/15 1300)   . clopidogrel      . clopidogrel  150 mg Oral Daily  . heparin      . HYDROmorphone      .   HYDROmorphone (DILAUDID) injection  1 mg Intravenous Once  . insulin aspart  0-9 Units Subcutaneous TID AC & HS  . insulin detemir  8 Units Subcutaneous Daily  . piperacillin-tazobactam (ZOSYN)  IV  3.375 g Intravenous Q12H  . sodium chloride flush  3 mL Intravenous Q12H  . vancomycin  750 mg Intravenous Q T,Th,Sa-HD   acetaminophen **OR** acetaminophen, fentaNYL (SUBLIMAZE) injection, heparin, lidocaine-prilocaine, morphine injection, ondansetron **OR** ondansetron (ZOFRAN) IV, oxyCODONE  Assessment/ Plan:  45 y.o. Hispanic female with end-stage renal disease on hemodialysis, diabetes mellitus type II with nephropathy, peripheral arterial disease, Charcot joint, gangrene of left toes, presents for Leg swelling [M79.89] Lower limb ischemia [I99.8] Cellulitis of toe of left foot [L03.032] Deep vein thrombosis (DVT) of popliteal vein of left lower extremity, unspecified chronicity (HCC) [I82.432]  TTS [facility].   1. End Stage Renal Disease: refuses dialysis today. Schedule dialysis for tomorrow. Patient expresses understanding.   2. Anemia of chronic kidney disease: hemoglobin 11.6 - hold epo due to current ischemia  3.  Secondary hyperparathyroidism: not currently on binders. Calcium at goal.   4.  Left leg DVT  - heparin gtt  5. Peripheral vascular disease - appreciate ID, vascular input and podiatry input.  - IV vancomycin and pip/tazo  6. Diabetes Mellitus type II with chronic kidney disease: insulin dependent - continue glucose control.    LOS: 3 Mackensie Pilson 10/24/20174:48 PM

## 2015-12-20 NOTE — Progress Notes (Signed)
ANTICOAGULATION CONSULT NOTE - FOLLOW UP  Consult  Pharmacy Consult for heparin drip Indication: VTE treatment  No Known Allergies  Patient Measurements: Height: 5\' 5"  (165.1 cm) Weight: 169 lb 12.8 oz (77 kg) IBW/kg (Calculated) : 57 Heparin Dosing Weight: 74 kg  Vital Signs: Temp: 98.4 F (36.9 C) (10/24 0430) Temp Source: Oral (10/24 0430) BP: 164/83 (10/24 0430) Pulse Rate: 88 (10/24 0430)  Labs:  Recent Labs  12/17/15 1258  12/18/15 0401 12/19/15 0023 12/19/15 1011 12/19/15 1749 12/20/15 0334  HGB 13.3  --  11.9* 11.6*  --   --  11.6*  HCT 39.1  --  34.7* 33.3*  --   --  35.5  PLT 190  --  183 184  --   --  179  HEPARINUNFRC  --   < >  --  0.28* 0.53 0.43 0.39  CREATININE 6.83*  --  4.05*  --   --   --  6.76*  < > = values in this interval not displayed.  Estimated Creatinine Clearance: 10.6 mL/min (by C-G formula based on SCr of 6.76 mg/dL (H)).   Medical History: Past Medical History:  Diagnosis Date  . Diabetes 1.5, managed as type 2 (HCC)   . ESRD on dialysis (HCC)   . Hyperlipidemia   . Hypertension   . Neuropathy (HCC)   . Renal insufficiency     Medications:  No anticoagulation in PTA meds  Assessment:  Goal of Therapy:  Heparin level 0.3-0.7 units/ml Monitor platelets by anticoagulation protocol: Yes   Plan:  Heparin level @ 0.14.   Will give Heparin bolus of 2200 units. Will increase heparin gtt to 1350 units/hr. Will recheck Heparin level @ 2000.  Heparin level resulted @ 0.24. Will increase heparin gtt to 1500 units/hr and will give heparin bolus of 1100 units x 1. Will recheck heparin level @ 2000.   10/23 00:30 heparin level 0.28. 2200 unit bolus and increase rate to 1650 units/hr. Recheck level in 8 hours.  10/23  HL @ 1011= 0.53. Will continue current drip rate and check confirmatory level in 8 hours at 1800.  10/23 HL @ 1830= 0.43. Second level with in range. Will continue to monitor HL with AM labs.   10/24 0334 HL  therapeutic. Continue current rate. Will continue to monitor HL/CBC daily.  Carola FrostNathan A Lelia Jons, Pharm.D., BCPS Clinical Pharmacist 12/20/2015,5:41 AM

## 2015-12-21 ENCOUNTER — Encounter: Payer: Self-pay | Admitting: Vascular Surgery

## 2015-12-21 LAB — CBC WITH DIFFERENTIAL/PLATELET
Basophils Absolute: 0.1 10*3/uL (ref 0–0.1)
Basophils Relative: 1 %
EOS ABS: 0.1 10*3/uL (ref 0–0.7)
Eosinophils Relative: 1 %
HEMATOCRIT: 35.3 % (ref 35.0–47.0)
HEMOGLOBIN: 11.4 g/dL — AB (ref 12.0–16.0)
LYMPHS ABS: 1.2 10*3/uL (ref 1.0–3.6)
Lymphocytes Relative: 10 %
MCH: 28.5 pg (ref 26.0–34.0)
MCHC: 32.4 g/dL (ref 32.0–36.0)
MCV: 88 fL (ref 80.0–100.0)
MONOS PCT: 7 %
Monocytes Absolute: 0.8 10*3/uL (ref 0.2–0.9)
NEUTROS ABS: 10.2 10*3/uL — AB (ref 1.4–6.5)
NEUTROS PCT: 81 %
Platelets: 164 10*3/uL (ref 150–440)
RBC: 4.02 MIL/uL (ref 3.80–5.20)
RDW: 14.9 % — ABNORMAL HIGH (ref 11.5–14.5)
WBC: 12.5 10*3/uL — ABNORMAL HIGH (ref 3.6–11.0)

## 2015-12-21 LAB — RENAL FUNCTION PANEL
ALBUMIN: 3 g/dL — AB (ref 3.5–5.0)
ANION GAP: 19 — AB (ref 5–15)
BUN: 46 mg/dL — ABNORMAL HIGH (ref 6–20)
CALCIUM: 9.7 mg/dL (ref 8.9–10.3)
CO2: 21 mmol/L — AB (ref 22–32)
CREATININE: 8.1 mg/dL — AB (ref 0.44–1.00)
Chloride: 88 mmol/L — ABNORMAL LOW (ref 101–111)
GFR, EST AFRICAN AMERICAN: 6 mL/min — AB (ref >60)
GFR, EST NON AFRICAN AMERICAN: 5 mL/min — AB (ref >60)
Glucose, Bld: 168 mg/dL — ABNORMAL HIGH (ref 65–99)
PHOSPHORUS: 8.4 mg/dL — AB (ref 2.5–4.6)
Potassium: 5.6 mmol/L — ABNORMAL HIGH (ref 3.5–5.1)
SODIUM: 128 mmol/L — AB (ref 135–145)

## 2015-12-21 LAB — BASIC METABOLIC PANEL
Anion gap: 17 — ABNORMAL HIGH (ref 5–15)
BUN: 44 mg/dL — AB (ref 6–20)
CHLORIDE: 89 mmol/L — AB (ref 101–111)
CO2: 22 mmol/L (ref 22–32)
CREATININE: 7.73 mg/dL — AB (ref 0.44–1.00)
Calcium: 9.6 mg/dL (ref 8.9–10.3)
GFR calc Af Amer: 6 mL/min — ABNORMAL LOW (ref >60)
GFR calc non Af Amer: 6 mL/min — ABNORMAL LOW (ref >60)
GLUCOSE: 189 mg/dL — AB (ref 65–99)
Potassium: 6.8 mmol/L (ref 3.5–5.1)
Sodium: 128 mmol/L — ABNORMAL LOW (ref 135–145)

## 2015-12-21 LAB — CBC
HCT: 33.1 % — ABNORMAL LOW (ref 35.0–47.0)
HEMOGLOBIN: 11.2 g/dL — AB (ref 12.0–16.0)
MCH: 29.2 pg (ref 26.0–34.0)
MCHC: 33.9 g/dL (ref 32.0–36.0)
MCV: 86.3 fL (ref 80.0–100.0)
PLATELETS: 172 10*3/uL (ref 150–440)
RBC: 3.83 MIL/uL (ref 3.80–5.20)
RDW: 15 % — ABNORMAL HIGH (ref 11.5–14.5)
WBC: 13 10*3/uL — AB (ref 3.6–11.0)

## 2015-12-21 LAB — GLUCOSE, CAPILLARY
GLUCOSE-CAPILLARY: 183 mg/dL — AB (ref 65–99)
Glucose-Capillary: 187 mg/dL — ABNORMAL HIGH (ref 65–99)
Glucose-Capillary: 128 mg/dL — ABNORMAL HIGH (ref 65–99)

## 2015-12-21 LAB — HEPARIN LEVEL (UNFRACTIONATED)
Heparin Unfractionated: 0.15 IU/mL — ABNORMAL LOW (ref 0.30–0.70)
Heparin Unfractionated: 0.33 IU/mL (ref 0.30–0.70)
Heparin Unfractionated: 0.43 IU/mL (ref 0.30–0.70)

## 2015-12-21 MED ORDER — HEPARIN BOLUS VIA INFUSION
1000.0000 [IU] | Freq: Once | INTRAVENOUS | Status: AC
  Administered 2015-12-21: 1000 [IU] via INTRAVENOUS
  Filled 2015-12-21: qty 1000

## 2015-12-21 MED ORDER — SODIUM POLYSTYRENE SULFONATE 15 GM/60ML PO SUSP
30.0000 g | Freq: Once | ORAL | Status: AC
  Administered 2015-12-21: 30 g via ORAL
  Filled 2015-12-21: qty 120

## 2015-12-21 MED ORDER — MORPHINE SULFATE (PF) 2 MG/ML IV SOLN
0.5000 mg | INTRAVENOUS | Status: DC | PRN
  Administered 2015-12-21: 0.5 mg via INTRAVENOUS
  Filled 2015-12-21: qty 1

## 2015-12-21 MED ORDER — SODIUM CHLORIDE 0.9 % IV SOLN
1.0000 g | Freq: Once | INTRAVENOUS | Status: DC
  Filled 2015-12-21: qty 10

## 2015-12-21 MED ORDER — ALBUTEROL SULFATE (2.5 MG/3ML) 0.083% IN NEBU
2.5000 mg | INHALATION_SOLUTION | Freq: Once | RESPIRATORY_TRACT | Status: AC
  Administered 2015-12-21: 2.5 mg via RESPIRATORY_TRACT
  Filled 2015-12-21: qty 3

## 2015-12-21 NOTE — Progress Notes (Signed)
1 Day Post-Op  Subjective: Patient seen. States that the feet are feeling better and she also relates having better feeling in the feet after her vascular procedure.  Objective: Vital signs in last 24 hours: Temp:  [98 F (36.7 C)-99.2 F (37.3 C)] 98.5 F (36.9 C) (10/25 1613) Pulse Rate:  [72-95] 85 (10/25 1613) Resp:  [16-20] 16 (10/25 1613) BP: (135-167)/(51-94) 161/68 (10/25 1613) SpO2:  [94 %-100 %] 95 % (10/25 1613) Weight:  [84 kg (185 lb 3 oz)-85.5 kg (188 lb 7.9 oz)] 84 kg (185 lb 3 oz) (10/25 1308) Last BM Date: 12/21/15  Intake/Output from previous day: 10/24 0701 - 10/25 0700 In: 430 [I.V.:330; IV Piggyback:100] Out: -  Intake/Output this shift: Total I/O In: 359.4 [P.O.:240; I.V.:119.4] Out: 1502 [Other:1500; Stool:2]  Minimal drainage is noted on the bandaging between the toes. Upon removal DP and PT pulses are trace palpable at this point. Still significant gangrenous changes of the entire left fourth toe and the distal and lateral aspect of the hallux. Third toe still slightly dusky but overall stable. Fifth toe is also dusky as well.  Lab Results:   Recent Labs  12/21/15 0455 12/21/15 0950  WBC 12.5* 13.0*  HGB 11.4* 11.2*  HCT 35.3 33.1*  PLT 164 172   BMET  Recent Labs  12/21/15 0455 12/21/15 0950  NA 128* 128*  K 6.8* 5.6*  CL 89* 88*  CO2 22 21*  GLUCOSE 189* 168*  BUN 44* 46*  CREATININE 7.73* 8.10*  CALCIUM 9.6 9.7   PT/INR No results for input(s): LABPROT, INR in the last 72 hours. ABG No results for input(s): PHART, HCO3 in the last 72 hours.  Invalid input(s): PCO2, PO2  Studies/Results: No results found.  Anti-infectives: Anti-infectives    Start     Dose/Rate Route Frequency Ordered Stop   12/17/15 0400  piperacillin-tazobactam (ZOSYN) IVPB 3.375 g     3.375 g 12.5 mL/hr over 240 Minutes Intravenous Every 12 hours 12/17/15 0217     12/17/15 0400  vancomycin (VANCOCIN) 1,500 mg in sodium chloride 0.9 % 500 mL IVPB     1,500 mg 250 mL/hr over 120 Minutes Intravenous  Once 12/17/15 0231 12/17/15 0516   12/17/15 0236  vancomycin (VANCOCIN) IVPB 750 mg/150 ml premix     750 mg 150 mL/hr over 60 Minutes Intravenous Every T-Th-Sa (Hemodialysis) 12/17/15 0231     12/16/15 2345  Ampicillin-Sulbactam (UNASYN) 3 g in sodium chloride 0.9 % 100 mL IVPB     3 g 100 mL/hr over 60 Minutes Intravenous  Once 12/16/15 2337 12/17/15 0051      Assessment/Plan: s/p Procedure(s): Lower Extremity Angiography (Left) Lower Extremity Intervention Assessment: Gangrene multiple toes left forefoot  Plan: Discussed with the patient through an interpreter the need for amputation of at least the left first and fourth toes. Discussed possible complications including inability to heal due to her circulation or diabetes as well as the possibility of residual infection. Discussed that she still may need further amputation including more toes or transmetatarsal amputation if she does have problems healing. We will obtain a consent form for amputation of the left first and fourth toes and possibly the fifth. Nothing by mouth after breakfast tomorrow. Plan for surgery tomorrow evening if we can get everything worked up  LOS: 4 days    Ricci Barkerodd W Ailie Gage 12/21/2015

## 2015-12-21 NOTE — Progress Notes (Signed)
ANTICOAGULATION CONSULT NOTE - FOLLOW UP  Consult  Pharmacy Consult for heparin drip Indication: VTE treatment  No Known Allergies  Patient Measurements: Height: 5\' 5"  (165.1 cm) Weight: 185 lb 3 oz (84 kg) IBW/kg (Calculated) : 57 Heparin Dosing Weight: 74 kg  Vital Signs: Temp: 98.2 F (36.8 C) (10/25 1308) Temp Source: Oral (10/25 1308) BP: 154/78 (10/25 1308) Pulse Rate: 93 (10/25 1308)  Labs:  Recent Labs  12/20/15 0334 12/21/15 0455 12/21/15 0950 12/21/15 1412  HGB 11.6* 11.4* 11.2*  --   HCT 35.5 35.3 33.1*  --   PLT 179 164 172  --   HEPARINUNFRC 0.39 0.15*  --  0.33  CREATININE 6.76* 7.73* 8.10*  --     Estimated Creatinine Clearance: 9.2 mL/min (by C-G formula based on SCr of 8.1 mg/dL (H)).   Medical History: Past Medical History:  Diagnosis Date  . Diabetes 1.5, managed as type 2 (HCC)   . ESRD on dialysis (HCC)   . Hyperlipidemia   . Hypertension   . Neuropathy (HCC)   . Renal insufficiency     Medications:  No anticoagulation in PTA meds  Assessment:  Goal of Therapy:  Heparin level 0.3-0.7 units/ml Monitor platelets by anticoagulation protocol: Yes   Plan:  Heparin level @ 0.14.   Will give Heparin bolus of 2200 units. Will increase heparin gtt to 1350 units/hr. Will recheck Heparin level @ 2000.  Heparin level resulted @ 0.24. Will increase heparin gtt to 1500 units/hr and will give heparin bolus of 1100 units x 1. Will recheck heparin level @ 2000.   10/23 00:30 heparin level 0.28. 2200 unit bolus and increase rate to 1650 units/hr. Recheck level in 8 hours.  10/23  HL @ 1011= 0.53. Will continue current drip rate and check confirmatory level in 8 hours at 1800.  10/23 HL @ 1830= 0.43. Second level with in range. Will continue to monitor HL with AM labs.   10/24 0334 HL therapeutic. Continue current rate. Will continue to monitor HL/CBC daily.  10/25 0455 HL subtherapeutic. Patient had arthrectomy yesterday, but RN reports  heparin has been infusing appropriately for at least this whole shift (since 7 PM). Heparin 1000 units IV x 1 bolus and increase rate to 1750 units/hr. Will recheck HL in 8 hours.  10/25 1412 HL therapeutic = 0.33. Continue current rate. Will continue to monitor CBC daily and will recheck HL in 8 hours based on CrCl < 3130mL/min. Per RN, no disruptions in therapy and no signs of bleeding.   Horris LatinoHolly Reginaldo Hazard, PharmD Pharmacy Resident 12/21/2015 3:54 PM

## 2015-12-21 NOTE — Progress Notes (Signed)
Pre Dialysis 

## 2015-12-21 NOTE — Progress Notes (Signed)
Dr Wynelle LinkKolluru called and notified of pt's K level of 6.8, MD states not to treat it bec pt will get dialysis within an hr. Will monitor pt closely for symptoms.

## 2015-12-21 NOTE — Progress Notes (Signed)
[facility] - [place] [facility]   PATIENT NAME: [name]    MR#:  [id number]  DATE OF BIRTH:  06-25-1970  SUBJECTIVE:  CHIEF COMPLAINT:  Patient denies any pain, chest pain or shortness of breath today. Resting comfortably  REVIEW OF SYSTEMS:  CONSTITUTIONAL: No fever, fatigue or weakness.  EYES: No blurred or double vision.  EARS, NOSE, AND THROAT: No tinnitus or ear pain.  RESPIRATORY: No cough, shortness of breath, wheezing or hemoptysis.  CARDIOVASCULAR: No chest pain, orthopnea, edema.  GASTROINTESTINAL: No nausea, vomiting, diarrhea or abdominal pain.  GENITOURINARY: No dysuria, hematuria.  ENDOCRINE: No polyuria, nocturia,  HEMATOLOGY: No anemia, easy bruising or bleeding SKIN: Left leg lesions MUSCULOSKELETAL: Reporting left leg pain improvement still has swelling  No joint pain or arthritis.   NEUROLOGIC: No tingling, numbness, weakness.  PSYCHIATRY: No anxiety or depression.   DRUG ALLERGIES:  No Known Allergies  VITALS:  Blood pressure (!) 154/78, pulse 93, temperature 98.2 F (36.8 C), temperature source Oral, resp. rate 16, height 5\' 5"  (1.651 m), weight 84 kg (185 lb 3 oz), SpO2 98 %.  PHYSICAL EXAMINATION:  GENERAL:  45 y.o.-year-old patient lying in the bed with no acute distress.  EYES: Pupils equal, round, reactive to light and accommodation. No scleral icterus. Extraocular muscles intact.  HEENT: Head atraumatic, normocephalic. Oropharynx and nasopharynx clear.  NECK:  Supple, no jugular venous distention. No thyroid enlargement, no tenderness.  LUNGS: Normal breath sounds bilaterally, no wheezing, rales,rhonchi or crepitation. No use of accessory muscles of respiration.  CARDIOVASCULAR: S1, S2 normal. No murmurs, rubs, or gallops.  ABDOMEN: Soft, nontender, nondistended. Bowel sounds present. No organomegaly or mass. Palpable left inguinal lymph node EXTREMITIES: Left lower extremity with arterial changes ,  discoloration , tender in the calf area , erythematous , 1-2+ dorsalis pedis and posterior tibialis pulse. Warm to touch Left fourth toe is necrotic. Great toe with partial necrosis of the plantar aspect Has multiple chronic raised, nontender nonerythematous, firm to hard lesions. No  discharge noticed. Soft tissue ulceration of the left plantar aspect noticed  No pedal edema, cyanosis, or clubbing.  NEUROLOGIC: Cranial nerves II through XII are intact. Muscle strength 5/5 in all extremities. Sensation intact. Gait not checked.  PSYCHIATRIC: The patient is alert and oriented x 3.  SKIN: No obvious rash, lesion, or ulcer.    LABORATORY PANEL:   CBC  Recent Labs Lab 12/21/15 0950  WBC 13.0*  HGB 11.2*  HCT 33.1*  PLT 172   ------------------------------------------------------------------------------------------------------------------  Chemistries   Recent Labs Lab 12/16/15 1900  12/21/15 0950  NA 131*  < > 128*  K 4.7  < > 5.6*  CL 92*  < > 88*  CO2 26  < > 21*  GLUCOSE 248*  < > 168*  BUN 38*  < > 46*  CREATININE 6.05*  < > 8.10*  CALCIUM 9.8  < > 9.7  AST 16  --   --   ALT 10*  --   --   ALKPHOS 139*  --   --   BILITOT 0.8  --   --   < > = values in this interval not displayed. ------------------------------------------------------------------------------------------------------------------  Cardiac Enzymes No results for input(s): TROPONINI in the last 168 hours. ------------------------------------------------------------------------------------------------------------------  RADIOLOGY:  No results found.  EKG:   Orders placed or performed during the hospital encounter of 07/12/15  . ED EKG  . ED EKG  . EKG 12-Lead  . EKG 12-Lead  ASSESSMENT AND PLAN:   45 year old Hispanic female came into the ED with a chief complaint of left lower extremity pain and swelling  #Hyperkalemia Patient is asymptomatic had dialysis today Repeat lites-potassium at   5.6 Appreciate nephrology recommendations  # Lower limb ischemia - pod#1 Patient had Crosser atherectomy of the left SFA and popliteal arteries,Percutaneous transluminal angioplasty and stent placement left superficial femoral artery and popliteal Tolerated the procedure well, better circulation in the left leg today Continue heparin drip, patient was given today's dose of Plavix in PACU  Appreciate vascular surgery recommendations Podiatry  will follow up with the patient today determine whether she has to have toes amputated or a transmetatarsal amputation versus a more proximal amputation,  appreciate Podiatry recommendations. Anticipating surgery tomorrow   # Left leg DVT (HCC) - continue heparin drip as above     #left lower leg Cellulitis with mid plantar ulceration - IV Zosyn and vancomycin  #Left lower leg lesion-unclear etiology Blood cultures  no growth in 4 days Outpatient follow-up with dermatology for skin biopsy is recommended  infectious disease consulted  Rapid HIV test and hepatitis B surface antigen are negative   #  Diabetes (HCC) - sliding scale insulin with corresponding glucose checks every 6 hours   #  HTN (hypertension) - continue home meds    # ESRD on dialysis [facility](HCC) -  dialysis per nephrology appreciate nephrology recommendations     #HLD (hyperlipidemia) - continue home meds once patient starts taking by mouth  Plan of care discussed with Dr. Wynelle Linkkolluru  All the records are reviewed and case discussed with Care Management/Social Workerr. Management plans discussed with the patient With the help of Spanish-speaking interpreter [name] and they are in agreement.  CODE STATUS: fc  TOTAL TIME TAKING CARE OF THIS PATIENT: 33 minutes.   POSSIBLE D/C IN 3-4 DAYS, DEPENDING ON CLINICAL CONDITION.  Note: This dictation was prepared with Dragon dictation along with smaller phrase technology. Any transcriptional errors that result from this process are  unintentional.   Ramonita LabGouru, Rakayla Ricklefs M.D on 12/21/2015 at 1:56 PM  Between 7am to 6pm - Pager - [phone] After 6pm go to [email or web address] - password [email or web address] [facility]  [facility]  Office  [phone]  CC: Primary care physician; No PCP Per Patient

## 2015-12-21 NOTE — Progress Notes (Signed)
Pt had large diarrhea during HD tx. Complete bed change. Floor RN and Kolluru MD aware.

## 2015-12-21 NOTE — Progress Notes (Signed)
Subjective:   Seen and examined hemodialysis treatment. UF goal of 1 litre. 2K bath.   Given kayexalate this morning. Now with diarrhea while on treatment.   History taken with assistance of Spanish interpreter.   Angiogram yesterday left lower extremity. Dr. Gilda CreaseSchnier    HEMODIALYSIS FLOWSHEET:  Blood Flow Rate (mL/min): 300 mL/min Arterial Pressure (mmHg): -120 mmHg Venous Pressure (mmHg): 150 mmHg Transmembrane Pressure (mmHg): 60 mmHg Ultrafiltration Rate (mL/min): 670 mL/min Dialysate Flow Rate (mL/min): 600 ml/min Conductivity: Machine : 14.1 Conductivity: Machine : 14.1 Dialysis Fluid Bolus: Normal Saline Bolus Amount (mL): 250 mL Dialysate Change: 2K Intra-Hemodialysis Comments: 186. Resting    Objective:  Vital signs in last 24 hours:  Temp:  [97.4 F (36.3 C)-99.2 F (37.3 C)] 98.2 F (36.8 C) (10/25 0947) Pulse Rate:  [72-98] 90 (10/25 1030) Resp:  [12-20] 18 (10/25 1030) BP: (135-177)/(51-96) 149/59 (10/25 1030) SpO2:  [93 %-100 %] 100 % (10/25 1030) Weight:  [84.6 kg (186 lb 8 oz)-85.5 kg (188 lb 7.9 oz)] 85.5 kg (188 lb 7.9 oz) (10/25 0947)  Weight change: 7.296 kg (16 lb 1.4 oz) Filed Weights   12/20/15 0500 12/21/15 0335 12/21/15 0947  Weight: 77.3 kg (170 lb 6.7 oz) 84.6 kg (186 lb 8 oz) 85.5 kg (188 lb 7.9 oz)    Intake/Output:    Intake/Output Summary (Last 24 hours) at 12/21/15 1038 Last data filed at 12/21/15 1036  Gross per 24 hour  Intake           789.39 ml  Output                1 ml  Net           788.39 ml     Physical Exam: General: No acute distress laying in the bed  HEENT Anicteric, moist oral mucous membranes  Neck supple  Pulm/lungs Normal breathing effort, clear to auscultation  CVS/Heart regular, no rub or gallop  Abdomen:  Soft, nontender, nondistended  Extremities: No pedal edema  Neurologic: Alert and oriented  Skin: hyperkeratosis skin lesions ranging up to 2 cm, on left lower extremity  Gangreneous toes of  left foot  Access: Left upper extremity AV graft.  Good bruit and thrill       Basic Metabolic Panel:   Recent Labs Lab 12/17/15 0522 12/17/15 1258 12/18/15 0401 12/20/15 0334 12/21/15 0455  NA 134* 129* 136 130* 128*  K 4.4 4.4 4.4 5.3* 6.8*  CL 98* 91* 97* 90* 89*  CO2 23 23 28 26 22   GLUCOSE 149* 155* 166* 130* 189*  BUN 42* 42* 19 37* 44*  CREATININE 6.39* 6.83* 4.05* 6.76* 7.73*  CALCIUM 9.3 9.6 9.3 9.8 9.6  PHOS  --  5.7*  --   --   --      CBC:  Recent Labs Lab 12/16/15 1900  12/18/15 0401 12/19/15 0023 12/20/15 0334 12/21/15 0455 12/21/15 0950  WBC 12.3*  < > 9.3 9.9 12.5* 12.5* 13.0*  NEUTROABS 10.0*  --   --   --  9.4* 10.2*  --   HGB 12.9  < > 11.9* 11.6* 11.6* 11.4* 11.2*  HCT 39.7  < > 34.7* 33.3* 35.5 35.3 33.1*  MCV 87.2  < > 86.1 85.9 88.0 88.0 86.3  PLT 196  < > 183 184 179 164 172  < > = values in this interval not displayed.    Microbiology:  Recent Results (from the past 720 hour(s))  MRSA PCR Screening  Status: None   Collection Time: 12/17/15  3:14 AM  Result Value Ref Range Status   MRSA by PCR NEGATIVE NEGATIVE Final    Comment:        The GeneXpert MRSA Assay (FDA approved for NASAL specimens only), is one component of a comprehensive MRSA colonization surveillance program. It is not intended to diagnose MRSA infection nor to guide or monitor treatment for MRSA infections.   Culture, blood (Routine X 2) w Reflex to ID Panel     Status: None (Preliminary result)   Collection Time: 12/17/15 12:58 PM  Result Value Ref Range Status   Specimen Description BLOOD RIGHT ASSIST CONTROL  Final   Special Requests   Final    BOTTLES DRAWN AEROBIC AND ANAEROBIC  6CCAERO, 7CCANA   Culture NO GROWTH 4 DAYS  Final   Report Status PENDING  Incomplete  Culture, blood (Routine X 2) w Reflex to ID Panel     Status: None (Preliminary result)   Collection Time: 12/17/15  1:04 PM  Result Value Ref Range Status   Specimen Description  BLOOD RIGHT HAND  Final   Special Requests   Final    BOTTLES DRAWN AEROBIC AND ANAEROBIC  3CCAERO, 4CCANA   Culture NO GROWTH 4 DAYS  Final   Report Status PENDING  Incomplete    Coagulation Studies: No results for input(s): LABPROT, INR in the last 72 hours.  Urinalysis: No results for input(s): COLORURINE, LABSPEC, PHURINE, GLUCOSEU, HGBUR, BILIRUBINUR, KETONESUR, PROTEINUR, UROBILINOGEN, NITRITE, LEUKOCYTESUR in the last 72 hours.  Invalid input(s): APPERANCEUR    Imaging: No results found.   Medications:   . heparin 1,750 Units/hr (12/21/15 0857)   . calcium gluconate  1 g Intravenous Once  . clopidogrel  150 mg Oral Daily  .  HYDROmorphone (DILAUDID) injection  1 mg Intravenous Once  . insulin aspart  0-9 Units Subcutaneous TID AC & HS  . insulin detemir  8 Units Subcutaneous Daily  . piperacillin-tazobactam (ZOSYN)  IV  3.375 g Intravenous Q12H  . sodium chloride flush  3 mL Intravenous Q12H  . vancomycin  750 mg Intravenous Q T,Th,Sa-HD   acetaminophen **OR** acetaminophen, fentaNYL (SUBLIMAZE) injection, heparin, lidocaine-prilocaine, morphine injection, ondansetron **OR** ondansetron (ZOFRAN) IV, oxyCODONE  Assessment/ Plan:  45 y.o. Hispanic female with end-stage renal disease on hemodialysis, diabetes mellitus type II with nephropathy, peripheral arterial disease, Charcot joint, gangrene of left toes, presents for Leg swelling [M79.89] Lower limb ischemia [I99.8] Cellulitis of toe of left foot [L03.032] Deep vein thrombosis (DVT) of popliteal vein of left lower extremity, unspecified chronicity (HCC) [I82.432]  TTS [facility].   1. End Stage Renal Disease: with hyperkalemia. 2K bath. Seen and examined on hemodialysis.   2. Anemia of chronic kidney disease: hemoglobin 11.2 - hold epo due to current ischemia  3.  Secondary hyperparathyroidism: not currently on binders. Calcium at goal.   4.  Left leg DVT  - heparin gtt  5. Peripheral  vascular disease: status post angiogram on 10/24 Dr. Gilda Crease - appreciate ID, vascular input and podiatry input.  - IV vancomycin and pip/tazo  6. Diabetes Mellitus type II with chronic kidney disease: insulin dependent - continue glucose control.    LOS: 4 Tennis Mckinnon 10/25/201710:38 AM

## 2015-12-21 NOTE — Progress Notes (Signed)
Post dialysis 

## 2015-12-21 NOTE — Progress Notes (Signed)
ANTICOAGULATION CONSULT NOTE - FOLLOW UP  Consult  Pharmacy Consult for heparin drip Indication: VTE treatment  No Known Allergies  Patient Measurements: Height: 5\' 5"  (165.1 cm) Weight: 185 lb 3 oz (84 kg) IBW/kg (Calculated) : 57 Heparin Dosing Weight: 74 kg  Vital Signs: Temp: 97.9 F (36.6 C) (10/25 1940) Temp Source: Oral (10/25 1940) BP: 97/51 (10/25 1940) Pulse Rate: 59 (10/25 1940)  Labs:  Recent Labs  12/20/15 0334 12/21/15 0455 12/21/15 0950 12/21/15 1412 12/21/15 2214  HGB 11.6* 11.4* 11.2*  --   --   HCT 35.5 35.3 33.1*  --   --   PLT 179 164 172  --   --   HEPARINUNFRC 0.39 0.15*  --  0.33 0.43  CREATININE 6.76* 7.73* 8.10*  --   --     Estimated Creatinine Clearance: 9.2 mL/min (by C-G formula based on SCr of 8.1 mg/dL (H)).   Medical History: Past Medical History:  Diagnosis Date  . Diabetes 1.5, managed as type 2 (HCC)   . ESRD on dialysis (HCC)   . Hyperlipidemia   . Hypertension   . Neuropathy (HCC)   . Renal insufficiency     Medications:  No anticoagulation in PTA meds  Assessment:  Goal of Therapy:  Heparin level 0.3-0.7 units/ml Monitor platelets by anticoagulation protocol: Yes   Plan:  Heparin level @ 0.14.   Will give Heparin bolus of 2200 units. Will increase heparin gtt to 1350 units/hr. Will recheck Heparin level @ 2000.  Heparin level resulted @ 0.24. Will increase heparin gtt to 1500 units/hr and will give heparin bolus of 1100 units x 1. Will recheck heparin level @ 2000.   10/23 00:30 heparin level 0.28. 2200 unit bolus and increase rate to 1650 units/hr. Recheck level in 8 hours.  10/23  HL @ 1011= 0.53. Will continue current drip rate and check confirmatory level in 8 hours at 1800.  10/23 HL @ 1830= 0.43. Second level with in range. Will continue to monitor HL with AM labs.   10/24 0334 HL therapeutic. Continue current rate. Will continue to monitor HL/CBC daily.  10/25 0455 HL subtherapeutic. Patient had  arthrectomy yesterday, but RN reports heparin has been infusing appropriately for at least this whole shift (since 7 PM). Heparin 1000 units IV x 1 bolus and increase rate to 1750 units/hr. Will recheck HL in 8 hours.  10/25 1412 HL therapeutic = 0.33. Continue current rate. Will continue to monitor CBC daily and will recheck HL in 8 hours based on CrCl < 3830mL/min. Per RN, no disruptions in therapy and no signs of bleeding.  10/25 HL therapeutic. Continue current rate. Pharmacy will monitor HL and CBC daily  Kristy Catoe A. Kathleenookson, VermontPharm.D., BCPS Clinical Pharmacist 12/21/2015 11:23 PM

## 2015-12-21 NOTE — Progress Notes (Signed)
Patient brought back from dialysis,no ss of distress noted, assessments remain unchanged from this a.m. Pt states that she ate during dialysis, unable to cover with insulin since pt has been out of the unit.

## 2015-12-21 NOTE — Progress Notes (Signed)
ANTICOAGULATION CONSULT NOTE - FOLLOW UP  Consult  Pharmacy Consult for heparin drip Indication: VTE treatment  No Known Allergies  Patient Measurements: Height: 5\' 5"  (165.1 cm) Weight: 186 lb 8 oz (84.6 kg) IBW/kg (Calculated) : 57 Heparin Dosing Weight: 74 kg  Vital Signs: Temp: 98.1 F (36.7 C) (10/25 0311) Temp Source: Oral (10/25 0311) BP: 138/51 (10/25 0311) Pulse Rate: 72 (10/25 0311)  Labs:  Recent Labs  12/19/15 0023  12/19/15 1749 12/20/15 0334 12/21/15 0455  HGB 11.6*  --   --  11.6* 11.4*  HCT 33.3*  --   --  35.5 35.3  PLT 184  --   --  179 164  HEPARINUNFRC 0.28*  < > 0.43 0.39 0.15*  CREATININE  --   --   --  6.76* 7.73*  < > = values in this interval not displayed.  Estimated Creatinine Clearance: 9.7 mL/min (by C-G formula based on SCr of 7.73 mg/dL (H)).   Medical History: Past Medical History:  Diagnosis Date  . Diabetes 1.5, managed as type 2 (HCC)   . ESRD on dialysis (HCC)   . Hyperlipidemia   . Hypertension   . Neuropathy (HCC)   . Renal insufficiency     Medications:  No anticoagulation in PTA meds  Assessment:  Goal of Therapy:  Heparin level 0.3-0.7 units/ml Monitor platelets by anticoagulation protocol: Yes   Plan:  Heparin level @ 0.14.   Will give Heparin bolus of 2200 units. Will increase heparin gtt to 1350 units/hr. Will recheck Heparin level @ 2000.  Heparin level resulted @ 0.24. Will increase heparin gtt to 1500 units/hr and will give heparin bolus of 1100 units x 1. Will recheck heparin level @ 2000.   10/23 00:30 heparin level 0.28. 2200 unit bolus and increase rate to 1650 units/hr. Recheck level in 8 hours.  10/23  HL @ 1011= 0.53. Will continue current drip rate and check confirmatory level in 8 hours at 1800.  10/23 HL @ 1830= 0.43. Second level with in range. Will continue to monitor HL with AM labs.   10/24 0334 HL therapeutic. Continue current rate. Will continue to monitor HL/CBC daily.  10/25 0455  HL subtherapeutic. Patient had arthrectomy yesterday, but RN reports heparin has been infusing appropriately for at least this whole shift (since 7 PM). Heparin 1000 units IV x 1 bolus and increase rate to 1750 units/hr. Will recheck HL in 8 hours.  Carola FrostNathan A Tiawanna Luchsinger, Pharm.D., BCPS Clinical Pharmacist 12/21/2015,5:59 AM

## 2015-12-21 NOTE — Progress Notes (Signed)
Dialysis complete

## 2015-12-21 NOTE — Progress Notes (Signed)
Dialysis started 

## 2015-12-21 NOTE — Progress Notes (Signed)
Pharmacy Antibiotic Note  [name] is a 45 y.o. female admitted on 12/16/2015 with lower leg cellulitis and ulceration.  Pharmacy has been consulted for vancomycin and piperacillin/tazobactam dosing.  Plan: Piperacillin/tazobactam 3.375 IV q 12 hours  Vancomycin 750 mg IV post-HD. Original HD schedule (T-Th-S) Pt refused HD on 10/24 and received dialysis 10/25 with a dose of vancomycin post HD. Trough should be ordered prior to next HD dose. Will follow to see when that will be.   Height: 5\' 5"  (165.1 cm) Weight: 185 lb 3 oz (84 kg) IBW/kg (Calculated) : 57  Temp (24hrs), Avg:98.4 F (36.9 C), Min:98 F (36.7 C), Max:99.2 F (37.3 C)   Recent Labs Lab 12/16/15 2221  12/17/15 1258 12/18/15 0401 12/19/15 0023 12/20/15 0334 12/21/15 0455 12/21/15 0950  WBC  --   < > 10.0 9.3 9.9 12.5* 12.5* 13.0*  CREATININE  --   < > 6.83* 4.05*  --  6.76* 7.73* 8.10*  LATICACIDVEN 1.1  --   --   --   --   --   --   --   < > = values in this interval not displayed.  Estimated Creatinine Clearance: 9.2 mL/min (by C-G formula based on SCr of 8.1 mg/dL (H)).    No Known Allergies  Antimicrobials this admission: Vanc 10/21 >> Piperacillin/tazobactam 10/21 >>  Dose adjustments this admission:   Microbiology results: 10/21 BCx: no growth pending 4 days 10/21 MRSA PCR: negative  Thank you for allowing pharmacy to be a part of this patient's care.   Horris LatinoHolly Gilliam, PharmD Pharmacy Resident 12/21/2015 4:08 PM

## 2015-12-22 ENCOUNTER — Encounter
Admission: EM | Disposition: A | Discharge status: Home / Self Care | Payer: Self-pay | Source: Home / Self Care | Attending: Internal Medicine

## 2015-12-22 ENCOUNTER — Inpatient Hospital Stay: Payer: Medicaid Other | Admitting: Certified Registered Nurse Anesthetist

## 2015-12-22 ENCOUNTER — Encounter: Payer: Self-pay | Admitting: Anesthesiology

## 2015-12-22 HISTORY — PX: AMPUTATION TOE: SHX6595

## 2015-12-22 LAB — GLUCOSE, CAPILLARY
GLUCOSE-CAPILLARY: 135 mg/dL — AB (ref 65–99)
GLUCOSE-CAPILLARY: 139 mg/dL — AB (ref 65–99)
Glucose-Capillary: 155 mg/dL — ABNORMAL HIGH (ref 65–99)
Glucose-Capillary: 140 mg/dL — ABNORMAL HIGH (ref 65–99)
Glucose-Capillary: 149 mg/dL — ABNORMAL HIGH (ref 65–99)

## 2015-12-22 LAB — CBC
HCT: 32.9 % — ABNORMAL LOW (ref 35.0–47.0)
Hemoglobin: 11 g/dL — ABNORMAL LOW (ref 12.0–16.0)
MCH: 28.9 pg (ref 26.0–34.0)
MCHC: 33.3 g/dL (ref 32.0–36.0)
MCV: 86.7 fL (ref 80.0–100.0)
PLATELETS: 149 10*3/uL — AB (ref 150–440)
RBC: 3.8 MIL/uL (ref 3.80–5.20)
RDW: 15.1 % — AB (ref 11.5–14.5)
WBC: 11.1 10*3/uL — ABNORMAL HIGH (ref 3.6–11.0)

## 2015-12-22 LAB — CULTURE, BLOOD (ROUTINE X 2)
CULTURE: NO GROWTH
CULTURE: NO GROWTH

## 2015-12-22 LAB — BASIC METABOLIC PANEL
Anion gap: 16 — ABNORMAL HIGH (ref 5–15)
BUN: 24 mg/dL — ABNORMAL HIGH (ref 6–20)
CALCIUM: 9.3 mg/dL (ref 8.9–10.3)
CO2: 26 mmol/L (ref 22–32)
CREATININE: 5.43 mg/dL — AB (ref 0.44–1.00)
Chloride: 93 mmol/L — ABNORMAL LOW (ref 101–111)
GFR, EST AFRICAN AMERICAN: 10 mL/min — AB (ref >60)
GFR, EST NON AFRICAN AMERICAN: 9 mL/min — AB (ref >60)
GLUCOSE: 178 mg/dL — AB (ref 65–99)
Potassium: 4.2 mmol/L (ref 3.5–5.1)
Sodium: 135 mmol/L (ref 135–145)

## 2015-12-22 LAB — VANCOMYCIN, TROUGH: Vancomycin Tr: 12 ug/mL — ABNORMAL LOW (ref 15–20)

## 2015-12-22 SURGERY — AMPUTATION, TOE
Anesthesia: General | Laterality: Left

## 2015-12-22 MED ORDER — SODIUM CHLORIDE 0.9 % IV SOLN
INTRAVENOUS | Status: DC | PRN
  Administered 2015-12-22: 18:00:00 via INTRAVENOUS

## 2015-12-22 MED ORDER — MIDAZOLAM HCL 2 MG/2ML IJ SOLN
INTRAMUSCULAR | Status: DC | PRN
  Administered 2015-12-22: 2 mg via INTRAVENOUS

## 2015-12-22 MED ORDER — ONDANSETRON HCL 4 MG/2ML IJ SOLN: 4.0000 mg | Freq: Once | INTRAMUSCULAR | Status: DC | PRN

## 2015-12-22 MED ORDER — THROMBIN 5000 UNITS EX SOLR
CUTANEOUS | Status: AC
  Filled 2015-12-22: qty 5000

## 2015-12-22 MED ORDER — VANCOMYCIN HCL 500 MG IV SOLR
500.0000 mg | Freq: Once | INTRAVENOUS | Status: AC
  Administered 2015-12-22: 500 mg via INTRAVENOUS
  Filled 2015-12-22: qty 500

## 2015-12-22 MED ORDER — FENTANYL CITRATE (PF) 100 MCG/2ML IJ SOLN: 25.0000 ug | INTRAMUSCULAR | Status: DC | PRN

## 2015-12-22 MED ORDER — EPHEDRINE SULFATE 50 MG/ML IJ SOLN
INTRAMUSCULAR | Status: DC | PRN
  Administered 2015-12-22 (×3): 5 mg via INTRAVENOUS

## 2015-12-22 MED ORDER — VANCOMYCIN HCL IN DEXTROSE 1-5 GM/200ML-% IV SOLN
1000.0000 mg | INTRAVENOUS | Status: DC
  Filled 2015-12-22: qty 200

## 2015-12-22 MED ORDER — THROMBIN 5000 UNITS EX SOLR
CUTANEOUS | Status: DC | PRN
  Administered 2015-12-22: 5000 [IU] via TOPICAL

## 2015-12-22 MED ORDER — ONDANSETRON HCL 4 MG/2ML IJ SOLN
INTRAMUSCULAR | Status: DC | PRN
  Administered 2015-12-22: 4 mg via INTRAVENOUS

## 2015-12-22 MED ORDER — FENTANYL CITRATE (PF) 100 MCG/2ML IJ SOLN
INTRAMUSCULAR | Status: DC | PRN
  Administered 2015-12-22: 25 ug via INTRAVENOUS
  Administered 2015-12-22: 50 ug via INTRAVENOUS
  Administered 2015-12-22: 25 ug via INTRAVENOUS

## 2015-12-22 SURGICAL SUPPLY — 50 items
BANDAGE ACE 4X5 VEL STRL LF (GAUZE/BANDAGES/DRESSINGS) IMPLANT
BLADE MED AGGRESSIVE (BLADE) IMPLANT
BLADE OSC/SAGITTAL MD 5.5X18 (BLADE) ×2 IMPLANT
BLADE SURG 15 STRL LF DISP TIS (BLADE) ×2 IMPLANT
BLADE SURG 15 STRL SS (BLADE) ×2
BLADE SURG MINI STRL (BLADE) IMPLANT
BNDG COHESIVE 4X5 WHT NS (GAUZE/BANDAGES/DRESSINGS) ×2 IMPLANT
BNDG ESMARK 4X12 TAN STRL LF (GAUZE/BANDAGES/DRESSINGS) ×2 IMPLANT
BNDG GAUZE 4.5X4.1 6PLY STRL (MISCELLANEOUS) ×2 IMPLANT
CANISTER SUCT 1200ML W/VALVE (MISCELLANEOUS) ×2 IMPLANT
CUFF TOURN 18 STER (MISCELLANEOUS) ×2 IMPLANT
CUFF TOURN DUAL PL 12 NO SLV (MISCELLANEOUS) IMPLANT
DRAPE FLUOR MINI C-ARM 54X84 (DRAPES) ×2 IMPLANT
DURAPREP 26ML APPLICATOR (WOUND CARE) ×2 IMPLANT
ELECT REM PT RETURN 9FT ADLT (ELECTROSURGICAL) ×2
ELECTRODE REM PT RTRN 9FT ADLT (ELECTROSURGICAL) ×1 IMPLANT
GAUZE FLUFF 18X24 1PLY STRL (GAUZE/BANDAGES/DRESSINGS) ×2 IMPLANT
GAUZE PETRO XEROFOAM 1X8 (MISCELLANEOUS) ×2 IMPLANT
GAUZE SPONGE 4X4 12PLY STRL (GAUZE/BANDAGES/DRESSINGS) ×2 IMPLANT
GAUZE STRETCH 2X75IN STRL (MISCELLANEOUS) ×2 IMPLANT
GLOVE BIO SURGEON STRL SZ7.5 (GLOVE) ×2 IMPLANT
GLOVE INDICATOR 8.0 STRL GRN (GLOVE) ×2 IMPLANT
GOWN STRL REUS W/ TWL LRG LVL3 (GOWN DISPOSABLE) ×2 IMPLANT
GOWN STRL REUS W/TWL LRG LVL3 (GOWN DISPOSABLE) ×2
HANDPIECE VERSAJET DEBRIDEMENT (MISCELLANEOUS) IMPLANT
KIT RM TURNOVER STRD PROC AR (KITS) ×2 IMPLANT
LABEL OR SOLS (LABEL) IMPLANT
NEEDLE FILTER BLUNT 18X 1/2SAF (NEEDLE) ×1
NEEDLE FILTER BLUNT 18X1 1/2 (NEEDLE) ×1 IMPLANT
NEEDLE HYPO 25X1 1.5 SAFETY (NEEDLE) ×4 IMPLANT
NS IRRIG 500ML POUR BTL (IV SOLUTION) ×2 IMPLANT
PACK EXTREMITY ARMC (MISCELLANEOUS) ×2 IMPLANT
PAD ABD DERMACEA PRESS 5X9 (GAUZE/BANDAGES/DRESSINGS) ×6 IMPLANT
SOL .9 NS 3000ML IRR  AL (IV SOLUTION)
SOL .9 NS 3000ML IRR UROMATIC (IV SOLUTION) IMPLANT
SOL PREP PVP 2OZ (MISCELLANEOUS) ×2
SOLUTION PREP PVP 2OZ (MISCELLANEOUS) ×1 IMPLANT
SPOGE SURGIFLO 8M (HEMOSTASIS) ×1
SPONGE SURGIFLO 8M (HEMOSTASIS) ×1 IMPLANT
STAPLER SKIN PROX 35W (STAPLE) ×2 IMPLANT
STOCKINETTE STRL 6IN 960660 (GAUZE/BANDAGES/DRESSINGS) ×2 IMPLANT
STRIP CLOSURE SKIN 1/4X4 (GAUZE/BANDAGES/DRESSINGS) IMPLANT
SUT ETHILON 4-0 (SUTURE) ×1
SUT ETHILON 4-0 FS2 18XMFL BLK (SUTURE) ×1
SUT ETHILON 5-0 FS-2 18 BLK (SUTURE) IMPLANT
SUT VIC AB 3-0 SH 27 (SUTURE) ×1
SUT VIC AB 3-0 SH 27X BRD (SUTURE) ×1 IMPLANT
SUTURE ETHLN 4-0 FS2 18XMF BLK (SUTURE) ×1 IMPLANT
SWAB DUAL CULTURE TRANS RED ST (MISCELLANEOUS) IMPLANT
SYRINGE 10CC LL (SYRINGE) ×2 IMPLANT

## 2015-12-22 NOTE — Progress Notes (Signed)
Pre Dialysis 

## 2015-12-22 NOTE — Progress Notes (Signed)
Pharmacy Antibiotic Note  [name] is a 45 y.o. female admitted on 12/16/2015 with lower leg cellulitis and ulceration.  Pharmacy has been consulted for vancomycin and piperacillin/tazobactam dosing. Patient having toe amputated 10/26.    Plan: Continue Piperacillin/tazobactam EI 3.375g IV Q12hr.    Will give vancomycin 500mg  IV x 1 for total daily dose of 1250mg  on 10/26. Will increase vancomycin to 1000mg  to be given during dialysis on (Tuesday/Thursday/Saturday).    Height: 5\' 5"  (165.1 cm) Weight: 182 lb 1.6 oz (82.6 kg) IBW/kg (Calculated) : 57  Temp (24hrs), Avg:97.8 F (36.6 C), Min:97.6 F (36.4 C), Max:98.2 F (36.8 C)   Recent Labs Lab 12/16/15 2221  12/18/15 0401 12/19/15 0023 12/20/15 0334 12/21/15 0455 12/21/15 0950 12/22/15 0436 12/22/15 0930  WBC  --   < > 9.3 9.9 12.5* 12.5* 13.0* 11.1*  --   CREATININE  --   < > 4.05*  --  6.76* 7.73* 8.10* 5.43*  --   LATICACIDVEN 1.1  --   --   --   --   --   --   --   --   VANCOTROUGH  --   --   --   --   --   --   --   --  12*  < > = values in this interval not displayed.  Estimated Creatinine Clearance: 13.6 mL/min (by C-G formula based on SCr of 5.43 mg/dL (H)).    No Known Allergies  Antimicrobials this admission: Vancomycin 10/21 >> Piperacillin/tazobactam 10/21 >>  Dose adjustments this admission: 10/25 vancomycin increased to 1000mg    Microbiology results: 10/21 BCx: no growth X  5 days 10/21 MRSA PCR: negative  Pharmacy will continue to monitor and adjust per consult.     MLS 12/22/2015 4:51 PM

## 2015-12-22 NOTE — Progress Notes (Signed)
ANTICOAGULATION CONSULT NOTE - FOLLOW UP  Consult  Pharmacy Consult for heparin drip Indication: VTE treatment  Assessment:  Pharmacy consulted for heparin drip management for 45 yo female being treated for left leg DVT. Patient is on day 7 of heparin. Patient received dialysis on 10/26. Patient currently in surgery for toe amputation and heparin drip is on hold.      Goal of Therapy:  Heparin level 0.3-0.7 units/ml Monitor platelets by anticoagulation protocol: Yes   Plan:  Will discontinue anti-Xa level ordered for 2200. Will follow-up postsurgical and reschedule anti-Xa level when heparin is resumed.   10/26 Heparin drip resumed after surgery ~2010 at previous rate of 1750 units/hr. Will schedule next Heparin level ~ 8 hrs later.       Allergies  Allergen Reactions  . Penicillins Other (See Comments)    Dizziness    Patient Measurements: Height: 5\' 5"  (165.1 cm) Weight: 182 lb 1.6 oz (82.6 kg) IBW/kg (Calculated) : 57 Heparin Dosing Weight: 74 kg  Vital Signs: Temp: 97.4 F (36.3 C) (10/26 1943) Temp Source: Oral (10/26 1606) BP: 103/61 (10/26 1948) Pulse Rate: 92 (10/26 1948)  Labs:  Recent Labs  12/21/15 0455 12/21/15 0950 12/21/15 1412 12/21/15 2214 12/22/15 0436  HGB 11.4* 11.2*  --   --  11.0*  HCT 35.3 33.1*  --   --  32.9*  PLT 164 172  --   --  149*  HEPARINUNFRC 0.15*  --  0.33 0.43  --   CREATININE 7.73* 8.10*  --   --  5.43*    Estimated Creatinine Clearance: 13.6 mL/min (by C-G formula based on SCr of 5.43 mg/dL (H)).   Medical History: Past Medical History:  Diagnosis Date  . Diabetes 1.5, managed as type 2 (HCC)   . ESRD on dialysis (HCC)   . Hyperlipidemia   . Hypertension   . Neuropathy (HCC)   . Renal insufficiency     Pharmacy will continue to monitor and adjust per consult.   Bari MantisKristin Jahlia Omura PharmD Clinical Pharmacist 12/22/2015

## 2015-12-22 NOTE — Anesthesia Postprocedure Evaluation (Signed)
Anesthesia Post Note  Patient: [name]  Procedure(s) Performed: Procedure(s) (LRB): AMPUTATION TOE (Left)  Patient location during evaluation: PACU Anesthesia Type: General Level of consciousness: awake and alert Pain management: pain level controlled Vital Signs Assessment: post-procedure vital signs reviewed and stable Respiratory status: spontaneous breathing, nonlabored ventilation, respiratory function stable and patient connected to nasal cannula oxygen Cardiovascular status: blood pressure returned to baseline and stable Postop Assessment: no signs of nausea or vomiting Anesthetic complications: no    Last Vitals:  Vitals:   12/22/15 2035 12/22/15 2039  BP: (!) 71/46 (!) 108/58  Pulse:    Resp:    Temp:      Last Pain:  Vitals:   12/22/15 2034  TempSrc: Axillary  PainSc:                  Lenard SimmerAndrew Jimma Ortman

## 2015-12-22 NOTE — Progress Notes (Signed)
Post dialysis assessment 

## 2015-12-22 NOTE — Transfer of Care (Signed)
Immediate Anesthesia Transfer of Care Note  Patient: [name]  Procedure(s) Performed: Procedure(s): AMPUTATION TOE (Left)  Patient Location: PACU  Anesthesia Type:General  Level of Consciousness: awake, alert  and oriented  Airway & Oxygen Therapy: Patient Spontanous Breathing and Patient connected to face mask oxygen  Post-op Assessment: Report given to RN and Post -op Vital signs reviewed and stable  Post vital signs: Reviewed and stable  Last Vitals:  Vitals:   12/22/15 1327 12/22/15 1606  BP: (!) 143/89 (!) 157/60  Pulse: 90 82  Resp:  18  Temp: 36.4 C 36.7 C    Last Pain:  Vitals:   12/22/15 1606  TempSrc: Oral  PainSc:       Patients Stated Pain Goal: 0 (12/21/15 2150)  Complications: No apparent anesthesia complications

## 2015-12-22 NOTE — Interval H&P Note (Signed)
History and Physical Interval Note:  12/22/2015 4:03 PM  [name]  has presented today for surgery, with the diagnosis of N/A  The various methods of treatment have been discussed with the patient and family. After consideration of risks, benefits and other options for treatment, the patient has consented to  Procedure(s): AMPUTATION TOE (Left) as a surgical intervention .  The patient's history has been reviewed, patient examined, no change in status, stable for surgery.  I have reviewed the patient's chart and labs.  Questions were answered to the patient's satisfaction.     Ricci Barkerodd W Sly Parlee

## 2015-12-22 NOTE — Progress Notes (Signed)
Dialysis initiated without complications 

## 2015-12-22 NOTE — Anesthesia Preprocedure Evaluation (Signed)
Anesthesia Evaluation  Patient identified by MRN, date of birth, ID band Patient awake    Reviewed: Allergy & Precautions, H&P , NPO status , Patient's Chart, lab work & pertinent test results, reviewed documented beta blocker date and time   History of Anesthesia Complications Negative for: history of anesthetic complications  Airway Mallampati: III  TM Distance: >3 FB Neck ROM: full    Dental no notable dental hx. (+) Upper Dentures, Lower Dentures   Pulmonary neg pulmonary ROS,    Pulmonary exam normal breath sounds clear to auscultation       Cardiovascular Exercise Tolerance: Good hypertension, (-) angina+ Peripheral Vascular Disease  (-) CAD, (-) Past MI, (-) Cardiac Stents and (-) CABG Normal cardiovascular exam(-) dysrhythmias (-) Valvular Problems/Murmurs Rhythm:regular Rate:Normal     Neuro/Psych neg Seizures  Neuromuscular disease (Peripheral neuropathy) negative psych ROS   GI/Hepatic negative GI ROS, Neg liver ROS,   Endo/Other  diabetes  Renal/GU ESRF and DialysisRenal disease  negative genitourinary   Musculoskeletal   Abdominal   Peds  Hematology negative hematology ROS (+)   Anesthesia Other Findings Past Medical History: No date: Diabetes 1.5, managed as type 2 (HCC) No date: ESRD on dialysis (HCC) No date: Hyperlipidemia No date: Hypertension No date: Neuropathy (HCC) No date: Renal insufficiency   Reproductive/Obstetrics negative OB ROS                             Anesthesia Physical Anesthesia Plan  ASA: IV  Anesthesia Plan: General   Post-op Pain Management:    Induction:   Airway Management Planned:   Additional Equipment:   Intra-op Plan:   Post-operative Plan:   Informed Consent: I have reviewed the patients History and Physical, chart, labs and discussed the procedure including the risks, benefits and alternatives for the proposed anesthesia  with the patient or authorized representative who has indicated his/her understanding and acceptance.   Dental Advisory Given  Plan Discussed with: Anesthesiologist, CRNA and Surgeon  Anesthesia Plan Comments:         Anesthesia Quick Evaluation

## 2015-12-22 NOTE — Anesthesia Procedure Notes (Signed)
Procedure Name: LMA Insertion Performed by: Wess Baney Pre-anesthesia Checklist: Patient identified, Patient being monitored, Timeout performed, Emergency Drugs available and Suction available Patient Re-evaluated:Patient Re-evaluated prior to inductionOxygen Delivery Method: Circle system utilized Preoxygenation: Pre-oxygenation with 100% oxygen Intubation Type: IV induction Ventilation: Mask ventilation without difficulty LMA: LMA inserted LMA Size: 4.0 Tube type: Oral Number of attempts: 1 Placement Confirmation: positive ETCO2 and breath sounds checked- equal and bilateral Tube secured with: Tape Dental Injury: Teeth and Oropharynx as per pre-operative assessment        

## 2015-12-22 NOTE — Progress Notes (Signed)
Pre dialysis assessment 

## 2015-12-22 NOTE — Progress Notes (Signed)
ANTICOAGULATION CONSULT NOTE - FOLLOW UP  Consult  Pharmacy Consult for heparin drip Indication: VTE treatment  Assessment:  Pharmacy consulted for heparin drip management for 45 yo female being treated for left leg DVT. Patient is on day 7 of heparin currently at 1,750 units/hr. Patient to have dialysis on 10/26.    Goal of Therapy:  Heparin level 0.3-0.7 units/ml Monitor platelets by anticoagulation protocol: Yes   Plan:  Patient has had two consecutive anti-Xa levels in range, will continue heparin at 1,750 units/hr. Will obtain follow-up CBC and Anti-Xa levels with am labs.   No Known Allergies  Patient Measurements: Height: 5\' 5"  (165.1 cm) Weight: 185 lb 3 oz (84 kg) IBW/kg (Calculated) : 57 Heparin Dosing Weight: 74 kg  Vital Signs: Temp: 97.6 F (36.4 C) (10/26 0925) Temp Source: Oral (10/26 0925) BP: 153/68 (10/26 1000) Pulse Rate: 81 (10/26 1000)  Labs:  Recent Labs  12/21/15 0455 12/21/15 0950 12/21/15 1412 12/21/15 2214 12/22/15 0436  HGB 11.4* 11.2*  --   --  11.0*  HCT 35.3 33.1*  --   --  32.9*  PLT 164 172  --   --  149*  HEPARINUNFRC 0.15*  --  0.33 0.43  --   CREATININE 7.73* 8.10*  --   --  5.43*    Estimated Creatinine Clearance: 13.7 mL/min (by C-G formula based on SCr of 5.43 mg/dL (H)).   Medical History: Past Medical History:  Diagnosis Date  . Diabetes 1.5, managed as type 2 (HCC)   . ESRD on dialysis (HCC)   . Hyperlipidemia   . Hypertension   . Neuropathy (HCC)   . Renal insufficiency     Pharmacy will continue to monitor and adjust per consult.   MLS 12/22/2015 10:13 AM

## 2015-12-22 NOTE — Progress Notes (Signed)
Spanish interpreter at the bedside, Surgical consent obtained, signed and placed in the chart.

## 2015-12-22 NOTE — H&P (View-Only) (Signed)
Reason for Consult: Gangrene left forefoot Referring Physician: Hospitalist  [name] is an 45 y.o. female.  HPI: The patient relates a two-month history of some increasing discoloration and swelling in her left leg. Denies any injury. Relates a history of surgery on her left foot about a year and a half ago with no problems healing. Presented to the emergency department where she was diagnosed with gangrenous changes to the toes on the left foot as well as a DVT in the left leg. Admitted for intervention and treatment.  Past Medical History:  Diagnosis Date  . 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 ESRD on dialysis ([place])   . Hyperlipidemia   . Hypertension   . Neuropathy ([place])   . Renal insufficiency     Past Surgical History:  Procedure Laterality Date  . AV FISTULA PLACEMENT    . LEG SURGERY      Family History  Problem Relation Age of Onset  . Diabetes Mellitus II Father     Social History:  reports that she has never smoked. She has never used smokeless tobacco. She reports that she does not drink alcohol or use drugs.  Allergies: No Known Allergies  Medications:  Scheduled: . insulin aspart  0-9 Units Subcutaneous TID AC & HS  . piperacillin-tazobactam (ZOSYN)  IV  3.375 g Intravenous Q12H  . sodium chloride flush  3 mL Intravenous Q12H  . vancomycin  750 mg Intravenous Q T,Th,Sa-HD    Results for orders placed or performed during the hospital encounter of 12/16/15 (from the past 48 hour(s))  Basic metabolic panel     Status: Abnormal   Collection Time: 12/16/15  7:00 PM  Result Value Ref Range   Sodium 131 (L) 135 - 145 mmol/L   Potassium 4.7 3.5 - 5.1 mmol/L   Chloride 92 (L) 101 - 111 mmol/L   CO2 26 22 - 32 mmol/L   Glucose, Bld 248 (H) 65 - 99 mg/dL   BUN 38 (H) 6 - 20 mg/dL   Creatinine, Ser 6.05 (H) 0.44 - 1.00 mg/dL   Calcium 9.8 8.9 - 10.3 mg/dL   GFR calc non Af Amer 7 (L) >60 mL/min   GFR calc Af Amer 9 (L) >60 mL/min    Comment:  (NOTE) The eGFR has been calculated using the CKD EPI equation. This calculation has not been validated in all clinical situations. eGFR's persistently <60 mL/min signify possible Chronic Kidney Disease.    Anion gap 13 5 - 15  CBC     Status: Abnormal   Collection Time: 12/16/15  7:00 PM  Result Value Ref Range   WBC 12.3 (H) 3.6 - 11.0 K/uL   RBC 4.55 3.80 - 5.20 MIL/uL   Hemoglobin 12.9 12.0 - 16.0 g/dL   HCT 39.7 35.0 - 47.0 %   MCV 87.2 80.0 - 100.0 fL   MCH 28.4 26.0 - 34.0 pg   MCHC 32.5 32.0 - 36.0 g/dL   RDW 14.7 (H) 11.5 - 14.5 %   Platelets 196 150 - 440 K/uL  Differential     Status: Abnormal   Collection Time: 12/16/15  7:00 PM  Result Value Ref Range   Neutrophils Relative % 81 %   Lymphocytes Relative 9 %   Lymphs Abs 1.1 1.0 - 3.6 K/uL   Monocytes Relative 8 %   Eosinophils Relative 1 %   Eosinophils Absolute 0.1 0 - 0.7 K/uL   Basophils Relative 1 %  Basophils Absolute 0.1 0 - 0.1 K/uL   Neutro Abs 10.0 (H) 1.4 - 6.5 K/uL    Comment: CORRECTED ON 10/20 AT 2244: PREVIOUSLY REPORTED AS 10.1   Monocytes Absolute 1.0 (H) 0.2 - 0.9 K/uL    Comment: CORRECTED ON 10/20 AT 2244: PREVIOUSLY REPORTED AS 0.9  Hepatic function panel     Status: Abnormal   Collection Time: 12/16/15  7:00 PM  Result Value Ref Range   Total Protein 8.1 6.5 - 8.1 g/dL   Albumin 3.6 3.5 - 5.0 g/dL   AST 16 15 - 41 U/L   ALT 10 (L) 14 - 54 U/L   Alkaline Phosphatase 139 (H) 38 - 126 U/L   Total Bilirubin 0.8 0.3 - 1.2 mg/dL   Bilirubin, Direct 0.2 0.1 - 0.5 mg/dL   Indirect Bilirubin 0.6 0.3 - 0.9 mg/dL  Lactic acid, plasma     Status: None   Collection Time: 12/16/15 10:21 PM  Result Value Ref Range   Lactic Acid, Venous 1.1 0.5 - 1.9 mmol/L  APTT     Status: Abnormal   Collection Time: 12/16/15 11:54 PM  Result Value Ref Range   aPTT 37 (H) 24 - 36 seconds    Comment:        IF BASELINE aPTT IS ELEVATED, SUGGEST PATIENT RISK ASSESSMENT BE USED TO DETERMINE  APPROPRIATE ANTICOAGULANT THERAPY.   Protime-INR     Status: Abnormal   Collection Time: 12/16/15 11:54 PM  Result Value Ref Range   Prothrombin Time 15.7 (H) 11.4 - 15.2 seconds   INR 1.24   Glucose, capillary     Status: Abnormal   Collection Time: 12/17/15  2:22 AM  Result Value Ref Range   Glucose-Capillary 254 (H) 65 - 99 mg/dL  MRSA PCR Screening     Status: None   Collection Time: 12/17/15  3:14 AM  Result Value Ref Range   MRSA by PCR NEGATIVE NEGATIVE    Comment:        The GeneXpert MRSA Assay (FDA approved for NASAL specimens only), is one component of a comprehensive MRSA colonization surveillance program. It is not intended to diagnose MRSA infection nor to guide or monitor treatment for MRSA infections.   Basic metabolic panel     Status: Abnormal   Collection Time: 12/17/15  5:22 AM  Result Value Ref Range   Sodium 134 (L) 135 - 145 mmol/L   Potassium 4.4 3.5 - 5.1 mmol/L   Chloride 98 (L) 101 - 111 mmol/L   CO2 23 22 - 32 mmol/L   Glucose, Bld 149 (H) 65 - 99 mg/dL   BUN 42 (H) 6 - 20 mg/dL   Creatinine, Ser 6.39 (H) 0.44 - 1.00 mg/dL   Calcium 9.3 8.9 - 10.3 mg/dL   GFR calc non Af Amer 7 (L) >60 mL/min   GFR calc Af Amer 8 (L) >60 mL/min    Comment: (NOTE) The eGFR has been calculated using the CKD EPI equation. This calculation has not been validated in all clinical situations. eGFR's persistently <60 mL/min signify possible Chronic Kidney Disease.    Anion gap 13 5 - 15  CBC     Status: Abnormal   Collection Time: 12/17/15  5:22 AM  Result Value Ref Range   WBC 9.5 3.6 - 11.0 K/uL   RBC 4.00 3.80 - 5.20 MIL/uL   Hemoglobin 11.8 (L) 12.0 - 16.0 g/dL   HCT 34.2 (L) 35.0 - 47.0 %   MCV 85.6 80.0 -  100.0 fL   MCH 29.6 26.0 - 34.0 pg   MCHC 34.6 32.0 - 36.0 g/dL   RDW 14.6 (H) 11.5 - 14.5 %   Platelets 173 150 - 440 K/uL  Glucose, capillary     Status: Abnormal   Collection Time: 12/17/15  7:58 AM  Result Value Ref Range   Glucose-Capillary  184 (H) 65 - 99 mg/dL   Comment 1 Notify RN   Heparin level (unfractionated)     Status: Abnormal   Collection Time: 12/17/15  9:53 AM  Result Value Ref Range   Heparin Unfractionated 0.14 (L) 0.30 - 0.70 IU/mL    Comment:        IF HEPARIN RESULTS ARE BELOW EXPECTED VALUES, AND PATIENT DOSAGE HAS BEEN CONFIRMED, SUGGEST FOLLOW UP TESTING OF ANTITHROMBIN III LEVELS.   Culture, blood (Routine X 2) w Reflex to ID Panel     Status: None (Preliminary result)   Collection Time: 12/17/15 12:58 PM  Result Value Ref Range   Specimen Description BLOOD RIGHT ASSIST CONTROL    Special Requests      BOTTLES DRAWN AEROBIC AND ANAEROBIC  6CCAERO, [place]   Culture NO GROWTH < 24 HOURS    Report Status PENDING   Renal function panel     Status: Abnormal   Collection Time: 12/17/15 12:58 PM  Result Value Ref Range   Sodium 129 (L) 135 - 145 mmol/L   Potassium 4.4 3.5 - 5.1 mmol/L   Chloride 91 (L) 101 - 111 mmol/L   CO2 23 22 - 32 mmol/L   Glucose, Bld 155 (H) 65 - 99 mg/dL   BUN 42 (H) 6 - 20 mg/dL   Creatinine, Ser 6.83 (H) 0.44 - 1.00 mg/dL   Calcium 9.6 8.9 - 10.3 mg/dL   Phosphorus 5.7 (H) 2.5 - 4.6 mg/dL   Albumin 3.6 3.5 - 5.0 g/dL   GFR calc non Af Amer 6 (L) >60 mL/min   GFR calc Af Amer 8 (L) >60 mL/min    Comment: (NOTE) The eGFR has been calculated using the CKD EPI equation. This calculation has not been validated in all clinical situations. eGFR's persistently <60 mL/min signify possible Chronic Kidney Disease.    Anion gap 15 5 - 15  CBC     Status: Abnormal   Collection Time: 12/17/15 12:58 PM  Result Value Ref Range   WBC 10.0 3.6 - 11.0 K/uL   RBC 4.51 3.80 - 5.20 MIL/uL   Hemoglobin 13.3 12.0 - 16.0 g/dL   HCT 39.1 35.0 - 47.0 %   MCV 86.7 80.0 - 100.0 fL   MCH 29.4 26.0 - 34.0 pg   MCHC 33.9 32.0 - 36.0 g/dL   RDW 14.9 (H) 11.5 - 14.5 %   Platelets 190 150 - 440 K/uL  Rapid HIV screen (HIV 1/2 Ab+Ag)     Status: None   Collection Time: 12/17/15 12:58 PM   Result Value Ref Range   HIV-1 P24 Antigen - HIV24 NON REACTIVE NON REACTIVE   HIV 1/2 Antibodies NON REACTIVE NON REACTIVE   Interpretation (HIV Ag Ab)      A non reactive test result means that HIV 1 or HIV 2 antibodies and HIV 1 p24 antigen were not detected in the specimen.  Culture, blood (Routine X 2) w Reflex to ID Panel     Status: None (Preliminary result)   Collection Time: 12/17/15  1:04 PM  Result Value Ref Range   Specimen Description BLOOD RIGHT HAND  Special Requests      BOTTLES DRAWN AEROBIC AND ANAEROBIC  3CCAERO, 4CCANA   Culture NO GROWTH < 24 HOURS    Report Status PENDING   Hepatitis B surface antibody     Status: None   Collection Time: 12/17/15  2:35 PM  Result Value Ref Range   Hep B S Ab Reactive     Comment: (NOTE)              Non Reactive: Inconsistent with immunity,                            less than 10 mIU/mL              Reactive:     Consistent with immunity,                            greater than 9.9 mIU/mL Performed At: [facility] [place], [place] [id number] Lindon Romp MD [id number]   Hepatitis B surface antigen     Status: None   Collection Time: 12/17/15  2:35 PM  Result Value Ref Range   Hepatitis B Surface Ag Negative Negative    Comment: (NOTE) Performed At: [facility] [place], [place] [id number] Lindon Romp MD [id number]   Heparin level (unfractionated)     Status: Abnormal   Collection Time: 12/17/15  8:37 PM  Result Value Ref Range   Heparin Unfractionated 0.24 (L) 0.30 - 0.70 IU/mL    Comment:        IF HEPARIN RESULTS ARE BELOW EXPECTED VALUES, AND PATIENT DOSAGE HAS BEEN CONFIRMED, SUGGEST FOLLOW UP TESTING OF ANTITHROMBIN III LEVELS.   Glucose, capillary     Status: Abnormal   Collection Time: 12/17/15  9:24 PM  Result Value Ref Range   Glucose-Capillary 289 (H) 65 - 99 mg/dL   Comment 1 Notify RN   CBC     Status: Abnormal   Collection Time:  12/18/15  4:01 AM  Result Value Ref Range   WBC 9.3 3.6 - 11.0 K/uL   RBC 4.03 3.80 - 5.20 MIL/uL   Hemoglobin 11.9 (L) 12.0 - 16.0 g/dL   HCT 34.7 (L) 35.0 - 47.0 %   MCV 86.1 80.0 - 100.0 fL   MCH 29.4 26.0 - 34.0 pg   MCHC 34.1 32.0 - 36.0 g/dL   RDW 14.9 (H) 11.5 - 14.5 %   Platelets 183 150 - 440 K/uL  Basic metabolic panel     Status: Abnormal   Collection Time: 12/18/15  4:01 AM  Result Value Ref Range   Sodium 136 135 - 145 mmol/L   Potassium 4.4 3.5 - 5.1 mmol/L   Chloride 97 (L) 101 - 111 mmol/L   CO2 28 22 - 32 mmol/L   Glucose, Bld 166 (H) 65 - 99 mg/dL   BUN 19 6 - 20 mg/dL   Creatinine, Ser 4.05 (H) 0.44 - 1.00 mg/dL   Calcium 9.3 8.9 - 10.3 mg/dL   GFR calc non Af Amer 12 (L) >60 mL/min   GFR calc Af Amer 14 (L) >60 mL/min    Comment: (NOTE) The eGFR has been calculated using the CKD EPI equation. This calculation has not been validated in all clinical situations. eGFR's persistently <60 mL/min signify possible Chronic Kidney Disease.    Anion gap 11 5 - 15  Glucose, capillary  Status: Abnormal   Collection Time: 12/18/15  7:27 AM  Result Value Ref Range   Glucose-Capillary 169 (H) 65 - 99 mg/dL   Comment 1 Notify RN   Glucose, capillary     Status: Abnormal   Collection Time: 12/18/15 11:19 AM  Result Value Ref Range   Glucose-Capillary 195 (H) 65 - 99 mg/dL   Comment 1 Notify RN     US Venous Img Lower Unilateral Left  Result Date: 12/16/2015 CLINICAL DATA:  Acute onset of left leg swelling and pain. Initial encounter. EXAM: LEFT LOWER EXTREMITY VENOUS DOPPLER ULTRASOUND TECHNIQUE: Gray-scale sonography with graded compression, as well as color Doppler and duplex ultrasound were performed to evaluate the lower extremity deep venous systems from the level of the common femoral vein and including the common femoral, femoral, profunda femoral, popliteal and calf veins including the posterior tibial, peroneal and gastrocnemius veins when visible. The  superficial great saphenous vein was also interrogated. Spectral Doppler was utilized to evaluate flow at rest and with distal augmentation maneuvers in the common femoral, femoral and popliteal veins. COMPARISON:  None. FINDINGS: Contralateral Common Femoral Vein: Respiratory phasicity is normal and symmetric with the symptomatic side. No evidence of thrombus. Normal compressibility. Common Femoral Vein: No evidence of thrombus. Normal compressibility, respiratory phasicity and response to augmentation. Saphenofemoral Junction: No evidence of thrombus. Normal compressibility and flow on color Doppler imaging. Profunda Femoral Vein: No evidence of thrombus. Normal compressibility and flow on color Doppler imaging. Femoral Vein: No evidence of thrombus. Normal compressibility, respiratory phasicity and response to augmentation. Popliteal Vein: No evidence of thrombus. Normal compressibility, respiratory phasicity and response to augmentation. Calf Veins: Occlusive thrombus is noted within within one of the patient's two left peroneal veins. Superficial Great Saphenous Vein: No evidence of thrombus. Normal compressibility and flow on color Doppler imaging. Venous Reflux:  None. Other Findings:  A 1.4 cm node is noted at the left inguinal region. IMPRESSION: 1. Occlusive deep venous thrombus noted within one of the patient's left peroneal veins. 2. 1.4 cm left inguinal node noted. These results were called by telephone at the time of interpretation on 12/16/2015 at 9:49 pm to Dr. Quentin Cornwall, who verbally acknowledged these results. Electronically Signed   By: Garald Balding M.D.   On: 12/16/2015 21:49   Dg Foot Complete Left  Result Date: 12/17/2015 CLINICAL DATA:  Patient is being seen for lower leg pain. She has visible swelling to her foot. Visible dried sores/blisters to her calf that have opened. She is having pain for the past few months and has been ambulating with assistance. EXAM: LEFT FOOT - COMPLETE 3+  VIEW COMPARISON:  04/09/2014 FINDINGS: Bones appear radiolucent. There is significant atherosclerotic calcification of the small vessels. Diffuse soft tissue swelling noted. Lisfranc dislocation of the midfoot again noted. No radiopaque foreign body or soft tissue gas identified. No acute fracture. 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  No evidence for acute  abnormality. Electronically Signed   By: Nolon Nations M.D.   On: 12/17/2015 20:19   Ct Extrem Lower W Cm Bil  Result Date: 12/17/2015 CLINICAL DATA:  End-stage renal disease, diabetes and neuropathy. Patient complains of left leg and foot pain with blistering seen of the left leg. Occlusive deep venous thrombosis within 1 of the patient's left peroneal veins on same day lower extremity ultrasound. EXAM: CT OF THE LOWER BILATERAL EXTREMITY WITH CONTRAST TECHNIQUE: Multidetector CT imaging of the lower bilateral extremity was performed according to the standard protocol following intravenous  contrast administration. COMPARISON:  None. CONTRAST:  143m ISOVUE-300 IOPAMIDOL (ISOVUE-300) INJECTION 61% FINDINGS: Bones/Joint/Cartilage There is misregistration artifact at the junction of the proximal middle third of the tibia and fibula on the reformatted images not be mistaken for fracture. No acute fracture of the tibia nor fibula is seen. There is slight femorotibial joint space narrowing of the knee. The visualized patellofemoral compartment is maintained without malalignment. No intra-articular loose bodies are noted within the knee. No acute tibial or fibular fracture is noted. There is no bone destruction. The ankle joint is maintained. Tiny subcortical cyst is noted posteriorly involving the talus. The subtalar joint is maintained. There is disorganization, subchondral and subcortical degenerative cystic lucencies involving the midfoot articulations especially at the base of the third through fifth metatarsals and between the lateral cuneiform  and cuboid. Findings have the appearance of early Charcot joint. Ligaments Suboptimally assessed by CT. Muscles and Tendons No intramuscular mass or hematoma.  Intact Achilles tendon. Soft tissues There is diffuse soft tissue edema. Plantar ulceration along the lateral aspect of the midfoot with mild soft tissue thickening. No bone destruction of the adjacent midfoot. There is extensive calcific arteriosclerosis of the leg from above knee popliteal artery through tibial trunk involving the anterior and posterior tibial arteries, perineal and dorsalis pedis arteries. IMPRESSION: Soft tissue ulceration along the plantar aspect of the midfoot without evidence of frank bone destruction. Disorganized and disrupted appearance of the midfoot articulations are more in keeping with Charcot joint. Extensive arteriosclerosis of the leg with diffuse mild soft tissue edema and induration. Electronically Signed   By: DAshley RoyaltyM.D.   On: 12/17/2015 00:07    Review of Systems  Constitutional: Negative for chills and fever.  HENT: Negative.   Eyes: Negative.   Respiratory: Negative.   Cardiovascular: Positive for leg swelling.       In the left leg  Gastrointestinal: Negative for nausea and vomiting.  Genitourinary: Negative.   Musculoskeletal: Negative.   Skin:       Swelling in the left leg discoloration in the toes  Neurological:       Some numbness and paresthesias in the feet due to her diabetes  Endo/Heme/Allergies: Negative.   Psychiatric/Behavioral: Negative.    Blood pressure 124/62, pulse 78, temperature 97.6 F (36.4 C), temperature source Oral, resp. rate 18, height 5' 5"  (1.651 m), weight 78 kg (172 lb), SpO2 98 %. Physical Exam  Cardiovascular:  DP and PT pulses are nonpalpable on the left. Trace on the right.  Musculoskeletal:  Stiff range of motion in the left foot with obvious Charcot deformity in the left foot, nonacute. Muscle testing deferred  Neurological:  Loss of protective  threshold with a monofilament bilateral. Proprioception impaired  Skin:  The skin is thin dry and atrophic. Significant edema with discoloration in the left lower extremity to the knee as compared to the right. Black gangrenous changes are noted to the left for which are dry with some gangrenous changes also noted at the distal tip of the left hallux. The remaining toes are somewhat dusky in appearance.    Assessment/Plan: Assessment: 1. Gangrene left forefoot. 2. Peripheral vascular disease. 3. Diabetes with associated neuropathy.  Plan: Discussed with the patient through an interpreter that there is no need for any action on the part of podiatry until after she has her vascular intervention. Discussed with the patient that pending the results of that intervention will determine whether she has to have toes amputated or a transmetatarsal amputation  versus a more proximal amputation. A gauze was applied between the toes to separate them and keep the skin from rubbing. For now we will await vascular intervention which at this point is planned for Tuesday.  Durward Fortes 12/18/2015, 12:56 PM

## 2015-12-22 NOTE — Progress Notes (Signed)
ANTICOAGULATION CONSULT NOTE - FOLLOW UP  Consult  Pharmacy Consult for heparin drip Indication: VTE treatment  Assessment:  Pharmacy consulted for heparin drip management for 45 yo female being treated for left leg DVT. Patient is on day 7 of heparin. Patient received dialysis on 10/26. Patient currently in surgery for toe amputation and heparin drip is on hold.      Goal of Therapy:  Heparin level 0.3-0.7 units/ml Monitor platelets by anticoagulation protocol: Yes   Plan:  Will discontinue anti-Xa level ordered for 2200. Will follow-up postsurgical and reschedule anti-Xa level when heparin is resumed.   Allergies  Allergen Reactions  . Penicillins Other (See Comments)    Dizziness    Patient Measurements: Height: 5\' 5"  (165.1 cm) Weight: 182 lb 1.6 oz (82.6 kg) IBW/kg (Calculated) : 57 Heparin Dosing Weight: 74 kg  Vital Signs: Temp: 98.1 F (36.7 C) (10/26 1606) Temp Source: Oral (10/26 1606) BP: 157/60 (10/26 1606) Pulse Rate: 82 (10/26 1606)  Labs:  Recent Labs  12/21/15 0455 12/21/15 0950 12/21/15 1412 12/21/15 2214 12/22/15 0436  HGB 11.4* 11.2*  --   --  11.0*  HCT 35.3 33.1*  --   --  32.9*  PLT 164 172  --   --  149*  HEPARINUNFRC 0.15*  --  0.33 0.43  --   CREATININE 7.73* 8.10*  --   --  5.43*    Estimated Creatinine Clearance: 13.6 mL/min (by C-G formula based on SCr of 5.43 mg/dL (H)).   Medical History: Past Medical History:  Diagnosis Date  . Diabetes 1.5, managed as type 2 ([facility])   . ESRD on dialysis ([facility])   . Hyperlipidemia   . Hypertension   . Neuropathy ([facility])   . Renal insufficiency     Pharmacy will continue to monitor and adjust per consult.   MLS 12/22/2015 4:55 PM

## 2015-12-22 NOTE — Op Note (Signed)
Date of operation: 12/21/2013.  Surgeon: Ricci Barkerodd W Ramces Shomaker DPM.  Preoperative diagnosis: Gangrene toes left forefoot.  Postoperative diagnosis: Same.  Procedure: Transmetatarsal amputation left forefoot.  Anesthesia: LMA.  Hemostasis: Pneumatic tourniquet left ankle 350 mmHg.  Estimated blood loss: Approximately 250 cc.  Pathology: Left forefoot.  Implants: Surgi-Flo with thrombin.  Complications: None apparent.  Operative indications this is a 45 year old female with recent admission for gangrenous changes in her left foot. Patient recently underwent revascularization procedure and the foot was optimized as far as circulation for amputation of the gangrenous toes.  Operative procedure: Patient was taken to the operating room and placed on the table in the supine position. Going satisfactory LMA anesthesia a pneumatic tourniquet was applied at the level of the left ankle. Foot was prepped and draped in the usual sterile fashion. Attention was then directed to the distal aspect of the left forefoot. Close inspection was made between the digits and there was noted to be significant gangrenous tissue all the way to the base of the first interspace extending onto the distal forefoot just proximal to the great toe as well as between the fourth and fifth toes. It was deemed that there was no viable closure options for amputation of the toes. Decision was then made for transmetatarsal amputation at this point. The left foot was then exsanguinated with an Esmarch and the tourniquet inflated to 250 mmHg. A dorsal incision was made from medial to lateral along the distal aspect of the forefoot proximal to the toes. Significant bleeding was still encountered and the tourniquet was deflated and the foot re-exsanguinated and the tourniquet inflated to 350 mmHg. Upon continued dissection there was still noted to be excessive bleeding noted and the tourniquet was deflated and the foot was re-exsanguinated and  the tourniquet inflated to 350 mmHg. Dissection was carried along the dorsal incision down to the level of bone across the metatarsals and using blunt dissection with a key elevator dissection was carried along the metatarsal shafts proximally and a healthy viable dorsal flap was then lifted off of the forefoot area soft tissues were freed from the metatarsal shafts and using a sagittal saw metatarsals 1 through 5 were then transected. A plantar incision was then made extending from medial to lateral across the forefoot region corresponding to the dorsal flap and the incision carried sharply down to the level of the capsular tissue beneath the joints. Dissection carried back proximally and then the entire forefoot was disarticulated and removed in toto. Multiple active bleeders were encountered and cauterized with a Bovie or tied with 3-0 Vicryl suture. Still noted to be significant bleeding most likely related to her calcified blood vessels. 3-0 Vicryl simple interrupted sutures were then placed deep in the wound for reapproximation followed by more superficial reapproximation using 4-0 Vicryl and 3-0 Vicryl simple interrupted sutures. At this point due to the amount of bleeding Surgi-Flo was activated with thrombin and then injected into the forefoot region deep to the closure already performed. This was allowed activate for a couple of minutes and then closure was continued with continued 4-0 Vicryl simple abrupted sutures followed by skin closure using staples. Xeroform 4 x 4's fluffs ABDs Kerlix and an Ace wrap were applied to the left lower extremity. Tourniquet was released. Patient tolerated the procedure and anesthesia well and was transported to the PACU with vital signs stable and in good condition.

## 2015-12-22 NOTE — Progress Notes (Signed)
Vancomycin trough sent to lab per order.

## 2015-12-22 NOTE — Progress Notes (Signed)
Dialysis access in left arm present for bruit and thrill.

## 2015-12-22 NOTE — Progress Notes (Signed)
[facility] [place] [facility]   PATIENT NAME: Stephanie LatinoSonia Reyes Curtis    MR#:  [id number]  DATE OF BIRTH:  [birth year]  SUBJECTIVE:  CHIEF COMPLAINT:  Patient was seen and examined in dialysis unit with the help of Spanish interpreter ,denies any pain, chest pain or shortness of breath today. Resting comfortably  REVIEW OF SYSTEMS:  CONSTITUTIONAL: No fever, fatigue or weakness.  EYES: No blurred or double vision.  EARS, NOSE, AND THROAT: No tinnitus or ear pain.  RESPIRATORY: No cough, shortness of breath, wheezing or hemoptysis.  CARDIOVASCULAR: No chest pain, orthopnea, edema.  GASTROINTESTINAL: No nausea, vomiting, diarrhea or abdominal pain.  GENITOURINARY: No dysuria, hematuria.  ENDOCRINE: No polyuria, nocturia,  HEMATOLOGY: No anemia, easy bruising or bleeding SKIN: Left leg lesions MUSCULOSKELETAL: Reporting left leg pain improvement still has swelling  No joint pain or arthritis.   NEUROLOGIC: No tingling, numbness, weakness.  PSYCHIATRY: No anxiety or depression.   DRUG ALLERGIES:  No Known Allergies  VITALS:  Blood pressure (!) 143/89, pulse 90, temperature 97.6 F (36.4 C), temperature source Oral, resp. rate (!) 24, height 5\' 5"  (1.651 m), weight 82.6 kg (182 lb 1.6 oz), SpO2 96 %.  PHYSICAL EXAMINATION:  GENERAL:  45 y.o.-year-old patient lying in the bed with no acute distress.  EYES: Pupils equal, round, reactive to light and accommodation. No scleral icterus. Extraocular muscles intact.  HEENT: Head atraumatic, normocephalic. Oropharynx and nasopharynx clear.  NECK:  Supple, no jugular venous distention. No thyroid enlargement, no tenderness.  LUNGS: Normal breath sounds bilaterally, no wheezing, rales,rhonchi or crepitation. No use of accessory muscles of respiration.  CARDIOVASCULAR: S1, S2 normal. No murmurs, rubs, or gallops.  ABDOMEN: Soft, nontender, nondistended. Bowel sounds present. No organomegaly or mass. Palpable left  inguinal lymph node EXTREMITIES: Left lower extremity with arterial changes , discoloration , tender in the calf area , erythematous , 1-2+ dorsalis pedis and posterior tibialis pulse. Warm to touch Left fourth toe is necrotic. Great toe with partial necrosis of the plantar aspect Has multiple chronic raised, nontender nonerythematous, firm to hard lesions. No  discharge noticed. Soft tissue ulceration of the left plantar aspect noticed  No pedal edema, cyanosis, or clubbing.  NEUROLOGIC: Cranial nerves II through XII are intact. Muscle strength 5/5 in all extremities. Sensation intact. Gait not checked.  PSYCHIATRIC: The patient is alert and oriented x 3.  SKIN: No obvious rash, lesion, or ulcer.    LABORATORY PANEL:   CBC  Recent Labs Lab 12/22/15 0436  WBC 11.1*  HGB 11.0*  HCT 32.9*  PLT 149*   ------------------------------------------------------------------------------------------------------------------  Chemistries   Recent Labs Lab 12/16/15 1900  12/22/15 0436  NA 131*  < > 135  K 4.7  < > 4.2  CL 92*  < > 93*  CO2 26  < > 26  GLUCOSE 248*  < > 178*  BUN 38*  < > 24*  CREATININE 6.05*  < > 5.43*  CALCIUM 9.8  < > 9.3  AST 16  --   --   ALT 10*  --   --   ALKPHOS 139*  --   --   BILITOT 0.8  --   --   < > = values in this interval not displayed. ------------------------------------------------------------------------------------------------------------------  Cardiac Enzymes No results for input(s): TROPONINI in the last 168 hours. ------------------------------------------------------------------------------------------------------------------  RADIOLOGY:  No results found.  EKG:   Orders placed or performed during the hospital encounter of 07/12/15  . ED  EKG  . ED EKG  . EKG 12-Lead  . EKG 12-Lead    ASSESSMENT AND PLAN:   45 year old Hispanic female came into the ED with a chief complaint of left lower extremity pain and swelling   # Lower  limb ischemia - pod#2 Patient had Crosser atherectomy of the left SFA and popliteal arteries,Percutaneous transluminal angioplasty and stent placement left superficial femoral artery and popliteal Tolerated the procedure well, better circulation in the left leg today Hold heparin drip prior to the surgery and the resume after surgery Hold Plavix for surgery and resume Plavix 75 mg after 3 days of surgery. Discussed with Dr. dew Appreciate vascular surgery recommendations Podiatry  schedule patient for surgery for toe amputation -appreciate Podiatry recommendations. Anticipating surgery today evening   # Left leg DVT (HCC) - continue heparin drip as above     #left lower leg Cellulitis with mid plantar ulceration - IV Zosyn and vancomycin Follow-up with ID after surgery regarding discharge antibiotics  #Left lower leg lesion-unclear etiology Blood cultures  no growth in 4 days Outpatient follow-up with dermatology for skin biopsy is recommended  infectious disease consulted  Rapid HIV test and hepatitis B surface antigen are negative   #  Diabetes (HCC) - sliding scale insulin with corresponding glucose checks every 6 hours   #Hyperkalemia resolved On dialysis  #  HTN (hypertension) - continue home meds    # ESRD on dialysis [facility]) -  dialysis per nephrology appreciate nephrology recommendations     #HLD (hyperlipidemia) - continue home meds once patient starts taking by mouth  Plan of care discussed with Dr. Wynelle Link  All the records are reviewed and case discussed with Care Management/Social Workerr. Management plans discussed with the patient With the help of Spanish-speaking interpreter [name] and they are in agreement.  CODE STATUS: fc  TOTAL TIME TAKING CARE OF THIS PATIENT: 33 minutes.   POSSIBLE D/C IN 3-4 DAYS, DEPENDING ON CLINICAL CONDITION.  Note: This dictation was prepared with Dragon dictation along with smaller phrase technology. Any transcriptional errors  that result from this process are unintentional.   Ramonita Lab M.D on 12/22/2015 at 2:02 PM  Between 7am to 6pm - Pager - [phone] After 6pm go to [email or web address] - password [email or web address] [facility]  [facility]  Office  [phone]  CC: Primary care physician; No PCP Per Patient

## 2015-12-22 NOTE — Progress Notes (Signed)
Pharmacy notified of vanc trough result. OK to give 750mg  vanc dose with HD per pharmacy.

## 2015-12-22 NOTE — Progress Notes (Signed)
HD completed without issue. 1.5L goal met. Patient tolerated well.   

## 2015-12-22 NOTE — Progress Notes (Signed)
Subjective:   Seen and examined hemodialysis treatment.   History taken with assistance of Spanish interpreter.     HEMODIALYSIS FLOWSHEET:  Blood Flow Rate (mL/min): 400 mL/min Arterial Pressure (mmHg): -180 mmHg Venous Pressure (mmHg): 190 mmHg Transmembrane Pressure (mmHg): 40 mmHg Ultrafiltration Rate (mL/min): 670 mL/min Dialysate Flow Rate (mL/min): 600 ml/min Conductivity: Machine : 14.3 Conductivity: Machine : 14.3 Dialysis Fluid Bolus: Normal Saline Bolus Amount (mL): 250 mL Dialysate Change:  (3k 2.5ca) Intra-Hemodialysis Comments: 428. pt sleeping. no change    Objective:  Vital signs in last 24 hours:  Temp:  [97.6 F (36.4 C)-98.5 F (36.9 C)] 97.6 F (36.4 C) (10/26 0925) Pulse Rate:  [59-95] 81 (10/26 1000) Resp:  [12-20] 13 (10/26 1000) BP: (97-175)/(31-94) 153/68 (10/26 1000) SpO2:  [93 %-100 %] 97 % (10/26 1000) Weight:  [84 kg (185 lb 3 oz)] 84 kg (185 lb 3 oz) (10/26 0925)  Weight change: 0.904 kg (1 lb 15.9 oz) Filed Weights   12/21/15 0947 12/21/15 1308 12/22/15 0925  Weight: 85.5 kg (188 lb 7.9 oz) 84 kg (185 lb 3 oz) 84 kg (185 lb 3 oz)    Intake/Output:    Intake/Output Summary (Last 24 hours) at 12/22/15 1022 Last data filed at 12/22/15 0600  Gross per 24 hour  Intake              450 ml  Output             1503 ml  Net            -1053 ml     Physical Exam: General: No acute distress laying in the bed  HEENT Anicteric, moist oral mucous membranes  Neck supple  Pulm/lungs Normal breathing effort, clear to auscultation  CVS/Heart regular, no rub or gallop  Abdomen:  Soft, nontender, nondistended  Extremities: No pedal edema  Neurologic: Alert and oriented  Skin: hyperkeratosis skin lesions ranging up to 2 cm, on left lower extremity  Gangreneous toes of left foot  Access: Left upper extremity AV graft.  Good bruit and thrill       Basic Metabolic Panel:   Recent Labs Lab 12/17/15 1258 12/18/15 0401 12/20/15 0334  12/21/15 0455 12/21/15 0950 12/22/15 0436  NA 129* 136 130* 128* 128* 135  K 4.4 4.4 5.3* 6.8* 5.6* 4.2  CL 91* 97* 90* 89* 88* 93*  CO2 23 28 26 22  21* 26  GLUCOSE 155* 166* 130* 189* 168* 178*  BUN 42* 19 37* 44* 46* 24*  CREATININE 6.83* 4.05* 6.76* 7.73* 8.10* 5.43*  CALCIUM 9.6 9.3 9.8 9.6 9.7 9.3  PHOS 5.7*  --   --   --  8.4*  --      CBC:  Recent Labs Lab 12/16/15 1900  12/19/15 0023 12/20/15 0334 12/21/15 0455 12/21/15 0950 12/22/15 0436  WBC 12.3*  < > 9.9 12.5* 12.5* 13.0* 11.1*  NEUTROABS 10.0*  --   --  9.4* 10.2*  --   --   HGB 12.9  < > 11.6* 11.6* 11.4* 11.2* 11.0*  HCT 39.7  < > 33.3* 35.5 35.3 33.1* 32.9*  MCV 87.2  < > 85.9 88.0 88.0 86.3 86.7  PLT 196  < > 184 179 164 172 149*  < > = values in this interval not displayed.    Microbiology:  Recent Results (from the past 720 hour(s))  MRSA PCR Screening     Status: None   Collection Time: 12/17/15  3:14 AM  Result Value  Ref Range Status   MRSA by PCR NEGATIVE NEGATIVE Final    Comment:        The GeneXpert MRSA Assay (FDA approved for NASAL specimens only), is one component of a comprehensive MRSA colonization surveillance program. It is not intended to diagnose MRSA infection nor to guide or monitor treatment for MRSA infections.   Culture, blood (Routine X 2) w Reflex to ID Panel     Status: None   Collection Time: 12/17/15 12:58 PM  Result Value Ref Range Status   Specimen Description BLOOD RIGHT ASSIST CONTROL  Final   Special Requests   Final    BOTTLES DRAWN AEROBIC AND ANAEROBIC  6CCAERO, 7CCANA   Culture NO GROWTH 5 DAYS  Final   Report Status 12/22/2015 FINAL  Final  Culture, blood (Routine X 2) w Reflex to ID Panel     Status: None   Collection Time: 12/17/15  1:04 PM  Result Value Ref Range Status   Specimen Description BLOOD RIGHT HAND  Final   Special Requests   Final    BOTTLES DRAWN AEROBIC AND ANAEROBIC  3CCAERO, 4CCANA   Culture NO GROWTH 5 DAYS  Final   Report  Status 12/22/2015 FINAL  Final    Coagulation Studies: No results for input(s): LABPROT, INR in the last 72 hours.  Urinalysis: No results for input(s): COLORURINE, LABSPEC, PHURINE, GLUCOSEU, HGBUR, BILIRUBINUR, KETONESUR, PROTEINUR, UROBILINOGEN, NITRITE, LEUKOCYTESUR in the last 72 hours.  Invalid input(s): APPERANCEUR    Imaging: No results found.   Medications:   . heparin 1,750 Units/hr (12/22/15 0600)   . calcium gluconate  1 g Intravenous Once  . clopidogrel  150 mg Oral Daily  .  HYDROmorphone (DILAUDID) injection  1 mg Intravenous Once  . insulin aspart  0-9 Units Subcutaneous TID AC & HS  . insulin detemir  8 Units Subcutaneous Daily  . piperacillin-tazobactam (ZOSYN)  IV  3.375 g Intravenous Q12H  . sodium chloride flush  3 mL Intravenous Q12H  . vancomycin  750 mg Intravenous Q T,Th,Sa-HD   acetaminophen **OR** acetaminophen, fentaNYL (SUBLIMAZE) injection, heparin, lidocaine-prilocaine, morphine injection, ondansetron **OR** ondansetron (ZOFRAN) IV, oxyCODONE  Assessment/ Plan:  45 y.o. Hispanic female with end-stage renal disease on hemodialysis, diabetes mellitus type II with nephropathy, peripheral arterial disease, Charcot joint, gangrene of left toes, presents for Leg swelling [M79.89] Lower limb ischemia [I99.8] Cellulitis of toe of left foot [L03.032] Deep vein thrombosis (DVT) of popliteal vein of left lower extremity, unspecified chronicity (HCC) [I82.432]  TTS [facility].   1. End Stage Renal Disease: with hyperkalemia. Seen and examined on hemodialysis. Monitor daily for dialysis need  2. Anemia of chronic kidney disease:   - hold epo due to current ischemia  3.  Secondary hyperparathyroidism: not currently on binders. Calcium at goal.   4.  Left leg DVT  - heparin gtt  5. Peripheral vascular disease: status post angiogram on 10/24 Dr. Gilda Crease - appreciate ID, vascular input and podiatry input.  - IV vancomycin and  pip/tazo - Surgery scheduled for later today.   6. Diabetes Mellitus type II with chronic kidney disease: insulin dependent - continue glucose control.    LOS: 5 [name] 10/26/201710:22 AM

## 2015-12-22 NOTE — Progress Notes (Signed)
Verbal order (Gouru) received to stop Heparin drip 3 hours prior to surgery. Surgery scheduled for 5:00 pm. Heparin stopped.

## 2015-12-23 ENCOUNTER — Encounter: Payer: Self-pay | Admitting: Podiatry

## 2015-12-23 LAB — PROTIME-INR
INR: 1.58
Prothrombin Time: 19 seconds — ABNORMAL HIGH (ref 11.4–15.2)

## 2015-12-23 LAB — BASIC METABOLIC PANEL
ANION GAP: 15 (ref 5–15)
BUN: 14 mg/dL (ref 6–20)
CALCIUM: 9.4 mg/dL (ref 8.9–10.3)
CO2: 26 mmol/L (ref 22–32)
Chloride: 97 mmol/L — ABNORMAL LOW (ref 101–111)
Creatinine, Ser: 3.87 mg/dL — ABNORMAL HIGH (ref 0.44–1.00)
GFR calc Af Amer: 15 mL/min — ABNORMAL LOW (ref >60)
GFR, EST NON AFRICAN AMERICAN: 13 mL/min — AB (ref >60)
GLUCOSE: 265 mg/dL — AB (ref 65–99)
Potassium: 4.2 mmol/L (ref 3.5–5.1)
SODIUM: 138 mmol/L (ref 135–145)

## 2015-12-23 LAB — CBC
HCT: 30.7 % — ABNORMAL LOW (ref 35.0–47.0)
Hemoglobin: 10 g/dL — ABNORMAL LOW (ref 12.0–16.0)
MCH: 29.2 pg (ref 26.0–34.0)
MCHC: 32.5 g/dL (ref 32.0–36.0)
MCV: 89.6 fL (ref 80.0–100.0)
PLATELETS: 144 10*3/uL — AB (ref 150–440)
RBC: 3.43 MIL/uL — ABNORMAL LOW (ref 3.80–5.20)
RDW: 15.2 % — AB (ref 11.5–14.5)
WBC: 10.6 10*3/uL (ref 3.6–11.0)

## 2015-12-23 LAB — GLUCOSE, CAPILLARY
GLUCOSE-CAPILLARY: 168 mg/dL — AB (ref 65–99)
GLUCOSE-CAPILLARY: 108 mg/dL — AB (ref 65–99)
Glucose-Capillary: 209 mg/dL — ABNORMAL HIGH (ref 65–99)
Glucose-Capillary: 182 mg/dL — ABNORMAL HIGH (ref 65–99)

## 2015-12-23 LAB — HEPARIN LEVEL (UNFRACTIONATED)
HEPARIN UNFRACTIONATED: 0.39 [IU]/mL (ref 0.30–0.70)
HEPARIN UNFRACTIONATED: 0.42 [IU]/mL (ref 0.30–0.70)

## 2015-12-23 MED ORDER — APIXABAN 5 MG PO TABS: 5.0000 mg | ORAL_TABLET | Freq: Two times a day (BID) | ORAL | Status: DC

## 2015-12-23 MED ORDER — AMOXICILLIN-POT CLAVULANATE 875-125 MG PO TABS
1.0000 | ORAL_TABLET | Freq: Two times a day (BID) | ORAL | Status: DC
  Administered 2015-12-23 (×2): 1 via ORAL
  Filled 2015-12-23 (×3): qty 1

## 2015-12-23 MED ORDER — WARFARIN SODIUM 2.5 MG PO TABS
7.5000 mg | ORAL_TABLET | Freq: Once | ORAL | Status: AC
  Administered 2015-12-23: 7.5 mg via ORAL
  Filled 2015-12-23: qty 1

## 2015-12-23 MED ORDER — WARFARIN - PHARMACIST DOSING INPATIENT: Freq: Every day | Status: DC

## 2015-12-23 MED ORDER — APIXABAN 5 MG PO TABS: 10.0000 mg | ORAL_TABLET | Freq: Two times a day (BID) | ORAL | Status: DC

## 2015-12-23 MED ORDER — HEPARIN (PORCINE) IN NACL 100-0.45 UNIT/ML-% IJ SOLN
1900.0000 [IU]/h | INTRAMUSCULAR | Status: DC
  Administered 2015-12-23 – 2015-12-24 (×2): 1750 [IU]/h via INTRAVENOUS
  Administered 2015-12-25: 1900 [IU]/h via INTRAVENOUS
  Filled 2015-12-23 (×7): qty 250

## 2015-12-23 NOTE — Progress Notes (Signed)
1 Day Post-Op  Subjective: Patient seen. Still having a significant amount of pain with her left foot at the surgical site.  Objective: Vital signs in last 24 hours: Temp:  [96.1 F (35.6 C)-98.1 F (36.7 C)] 97.6 F (36.4 C) (10/27 0427) Pulse Rate:  [63-93] 72 (10/27 1123) Resp:  [16-19] 16 (10/27 1123) BP: (71-157)/(14-89) 143/81 (10/27 1123) SpO2:  [91 %-100 %] 98 % (10/27 1123) Weight:  [84.8 kg (186 lb 14.4 oz)] 84.8 kg (186 lb 14.4 oz) (10/27 0429) Last BM Date: 12/21/15  Intake/Output from previous day: 10/26 0701 - 10/27 0700 In: 624.7 [I.V.:424.7; IV Piggyback:200] Out: 1750 [Blood:250] Intake/Output this shift: No intake/output data recorded.  The bandages dry and intact. Upon removal there is minimal bleeding on the bandage. Incision well coapted with significant reduction in erythema and edema. Overall appears stable.  Lab Results:   Recent Labs  12/22/15 0436 12/23/15 0339  WBC 11.1* 10.6  HGB 11.0* 10.0*  HCT 32.9* 30.7*  PLT 149* 144*   BMET  Recent Labs  12/22/15 0436 12/23/15 0339  NA 135 138  K 4.2 4.2  CL 93* 97*  CO2 26 26  GLUCOSE 178* 265*  BUN 24* 14  CREATININE 5.43* 3.87*  CALCIUM 9.3 9.4   PT/INR No results for input(s): LABPROT, INR in the last 72 hours. ABG No results for input(s): PHART, HCO3 in the last 72 hours.  Invalid input(s): PCO2, PO2  Studies/Results: No results found.  Anti-infectives: Anti-infectives    Start     Dose/Rate Route Frequency Ordered Stop   12/24/15 1200  vancomycin (VANCOCIN) IVPB 1000 mg/200 mL premix     1,000 mg 200 mL/hr over 60 Minutes Intravenous Every T-Th-Sa (Hemodialysis) 12/22/15 1655     12/22/15 1330  vancomycin (VANCOCIN) 500 mg in sodium chloride 0.9 % 100 mL IVPB     500 mg 100 mL/hr over 60 Minutes Intravenous  Once 12/22/15 1236 12/22/15 1540   12/17/15 0400  piperacillin-tazobactam (ZOSYN) IVPB 3.375 g     3.375 g 12.5 mL/hr over 240 Minutes Intravenous Every 12 hours  12/17/15 0217     12/17/15 0400  vancomycin (VANCOCIN) 1,500 mg in sodium chloride 0.9 % 500 mL IVPB     1,500 mg 250 mL/hr over 120 Minutes Intravenous  Once 12/17/15 0231 12/17/15 0516   12/17/15 0236  vancomycin (VANCOCIN) IVPB 750 mg/150 ml premix  Status:  Discontinued     750 mg 150 mL/hr over 60 Minutes Intravenous Every T-Th-Sa (Hemodialysis) 12/17/15 0231 12/22/15 1655   12/16/15 2345  Ampicillin-Sulbactam (UNASYN) 3 g in sodium chloride 0.9 % 100 mL IVPB     3 g 100 mL/hr over 60 Minutes Intravenous  Once 12/16/15 2337 12/17/15 0051      Assessment/Plan: s/p Procedure(s): AMPUTATION TOE (Left) Assessment: 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 Discussed with the patient through her daughter interpreting that she should be able to put a little weight on the heel for transferring from bed to a chair. Patient will not need any dressing changes at this point. Plan on following her up outpatient in approximately one week. Should be stable for discharge by tomorrow.  LOS: 6 days    Ricci Barkerodd W Orlandria Kissner 12/23/2015

## 2015-12-23 NOTE — Progress Notes (Signed)
Subjective:   Hemodialysis yesterday. Tolerated treatment well. UF 1.5 litres  Left toe amputation by Dr. Alberteen Spindle yesterday  History taken with assistance of Spanish interpreter.    Objective:  Vital signs in last 24 hours:  Temp:  [96.1 F (35.6 C)-98.2 F (36.8 C)] 97.6 F (36.4 C) (10/27 0427) Pulse Rate:  [63-93] 63 (10/27 0743) Resp:  [13-24] 16 (10/27 0743) BP: (71-171)/(14-89) 136/58 (10/27 0743) SpO2:  [91 %-100 %] 96 % (10/27 0743) Weight:  [82.6 kg (182 lb 1.6 oz)-84.8 kg (186 lb 14.4 oz)] 84.8 kg (186 lb 14.4 oz) (10/27 0429)  Weight change: -1.5 kg (-3 lb 4.9 oz) Filed Weights   12/22/15 0925 12/22/15 1227 12/23/15 0429  Weight: 84 kg (185 lb 3 oz) 82.6 kg (182 lb 1.6 oz) 84.8 kg (186 lb 14.4 oz)    Intake/Output:    Intake/Output Summary (Last 24 hours) at 12/23/15 1047 Last data filed at 12/23/15 0420  Gross per 24 hour  Intake           624.67 ml  Output             1750 ml  Net         -1125.33 ml     Physical Exam: General: No acute distress laying in the bed  HEENT Anicteric, moist oral mucous membranes  Neck supple  Pulm/lungs Normal breathing effort, clear to auscultation  CVS/Heart regular, no rub or gallop  Abdomen:  Soft, nontender, nondistended  Extremities: No pedal edema  Neurologic: Alert and oriented  Skin: Left foot in dressings, clean and dry  Access: Left upper extremity AV graft.  Good bruit and thrill       Basic Metabolic Panel:   Recent Labs Lab 12/17/15 1258  12/20/15 0334 12/21/15 0455 12/21/15 0950 12/22/15 0436 12/23/15 0339  NA 129*  < > 130* 128* 128* 135 138  K 4.4  < > 5.3* 6.8* 5.6* 4.2 4.2  CL 91*  < > 90* 89* 88* 93* 97*  CO2 23  < > 26 22 21* 26 26  GLUCOSE 155*  < > 130* 189* 168* 178* 265*  BUN 42*  < > 37* 44* 46* 24* 14  CREATININE 6.83*  < > 6.76* 7.73* 8.10* 5.43* 3.87*  CALCIUM 9.6  < > 9.8 9.6 9.7 9.3 9.4  PHOS 5.7*  --   --   --  8.4*  --   --   < > = values in this interval not  displayed.   CBC:  Recent Labs Lab 12/16/15 1900  12/20/15 0334 12/21/15 0455 12/21/15 0950 12/22/15 0436 12/23/15 0339  WBC 12.3*  < > 12.5* 12.5* 13.0* 11.1* 10.6  NEUTROABS 10.0*  --  9.4* 10.2*  --   --   --   HGB 12.9  < > 11.6* 11.4* 11.2* 11.0* 10.0*  HCT 39.7  < > 35.5 35.3 33.1* 32.9* 30.7*  MCV 87.2  < > 88.0 88.0 86.3 86.7 89.6  PLT 196  < > 179 164 172 149* 144*  < > = values in this interval not displayed.    Microbiology:  Recent Results (from the past 720 hour(s))  MRSA PCR Screening     Status: None   Collection Time: 12/17/15  3:14 AM  Result Value Ref Range Status   MRSA by PCR NEGATIVE NEGATIVE Final    Comment:        The GeneXpert MRSA Assay (FDA approved for NASAL specimens only), is one component  of a comprehensive MRSA colonization surveillance program. It is not intended to diagnose MRSA infection nor to guide or monitor treatment for MRSA infections.   Culture, blood (Routine X 2) w Reflex to ID Panel     Status: None   Collection Time: 12/17/15 12:58 PM  Result Value Ref Range Status   Specimen Description BLOOD RIGHT ASSIST CONTROL  Final   Special Requests   Final    BOTTLES DRAWN AEROBIC AND ANAEROBIC  6CCAERO, 7CCANA   Culture NO GROWTH 5 DAYS  Final   Report Status 12/22/2015 FINAL  Final  Culture, blood (Routine X 2) w Reflex to ID Panel     Status: None   Collection Time: 12/17/15  1:04 PM  Result Value Ref Range Status   Specimen Description BLOOD RIGHT HAND  Final   Special Requests   Final    BOTTLES DRAWN AEROBIC AND ANAEROBIC  3CCAERO, 4CCANA   Culture NO GROWTH 5 DAYS  Final   Report Status 12/22/2015 FINAL  Final    Coagulation Studies: No results for input(s): LABPROT, INR in the last 72 hours.  Urinalysis: No results for input(s): COLORURINE, LABSPEC, PHURINE, GLUCOSEU, HGBUR, BILIRUBINUR, KETONESUR, PROTEINUR, UROBILINOGEN, NITRITE, LEUKOCYTESUR in the last 72 hours.  Invalid input(s): APPERANCEUR     Imaging: No results found.   Medications:   . heparin Stopped (12/22/15 1352)   . insulin aspart  0-9 Units Subcutaneous TID AC & HS  . insulin detemir  8 Units Subcutaneous Daily  . piperacillin-tazobactam (ZOSYN)  IV  3.375 g Intravenous Q12H  . sodium chloride flush  3 mL Intravenous Q12H  . [START ON 12/24/2015] vancomycin  1,000 mg Intravenous Q T,Th,Sa-HD   acetaminophen **OR** acetaminophen, fentaNYL (SUBLIMAZE) injection, heparin, lidocaine-prilocaine, morphine injection, ondansetron **OR** ondansetron (ZOFRAN) IV, oxyCODONE  Assessment/ Plan:  45 y.o. Hispanic female with end-stage renal disease on hemodialysis, diabetes mellitus type II with nephropathy, peripheral arterial disease, Charcot joint, gangrene of left toes, presents for Leg swelling [M79.89] Lower limb ischemia [I99.8] Cellulitis of toe of left foot [L03.032] Deep vein thrombosis (DVT) of popliteal vein of left lower extremity, unspecified chronicity (HCC) [I82.432]  [facility]   1. End Stage Renal Disease: with hyperkalemia. Seen and examined on hemodialysis. Monitor daily for dialysis need - Dialysis for tomorrow. Continue TTS schedule  2. Anemia of chronic kidney disease:   - hold epo due to current ischemia  3.  Secondary hyperparathyroidism: not currently on binders. Calcium at goal.   4.  Left leg DVT  - heparin gtt. Transition to Eliquis.   5. Peripheral vascular disease: status post angiogram on 10/24 Dr. Gilda CreaseSchnier. Status post amputation Dr. Alberteen Spindleline on 10/26 - appreciate ID, vascular input and podiatry input.  - IV vancomycin and pip/tazo  6. Diabetes Mellitus type II with chronic kidney disease: insulin dependent - continue glucose control.    LOS: 6 [name] 10/27/201710:47 AM

## 2015-12-23 NOTE — Progress Notes (Addendum)
Pharmacy Antibiotic Note  [name] is a 45 y.o. female admitted on 12/16/2015 with lower leg cellulitis and ulceration.  Pharmacy has been consulted for vancomycin and piperacillin/tazobactam dosing. Patient had toe amputated 10/26. On HD TTS.  Plan: Piperacillin/tazobactam discontinued  Vancomycin dose increased 10/27 to 1000 mg IV to be given during dialysis on (Tuesday/Thursday/Saturday). Will follow dialysis schedule for trough. Per MD note, ID recommended IV vancomycin during hemodialysis for the next 2 weeks and Augmentin 875 mg twice a day for 2 weeks.   Height: 5\' 5"  (165.1 cm) Weight: 186 lb 14.4 oz (84.8 kg) IBW/kg (Calculated) : 57  Temp (24hrs), Avg:97.3 F (36.3 C), Min:96.1 F (35.6 C), Max:98.1 F (36.7 C)   Recent Labs Lab 12/16/15 2221  12/20/15 0334 12/21/15 0455 12/21/15 0950 12/22/15 0436 12/22/15 0930 12/23/15 0339  WBC  --   < > 12.5* 12.5* 13.0* 11.1*  --  10.6  CREATININE  --   < > 6.76* 7.73* 8.10* 5.43*  --  3.87*  LATICACIDVEN 1.1  --   --   --   --   --   --   --   VANCOTROUGH  --   --   --   --   --   --  12*  --   < > = values in this interval not displayed.  Estimated Creatinine Clearance: 19.3 mL/min (by C-G formula based on SCr of 3.87 mg/dL (H)).    Allergies  Allergen Reactions  . Penicillins Other (See Comments)    Dizziness    Antimicrobials this admission: Vancomycin 10/21 >> Piperacillin/tazobactam 10/21 >> 10/27 Augmentin 10/27 >>  Dose adjustments this admission: 10/25 vancomycin increased to 1000mg    Microbiology results: 10/21 BCx: no growth X  5 days 10/21 MRSA PCR: negative  Pharmacy will continue to monitor and adjust per consult.    [name], PharmD Pharmacy Resident 12/23/2015 2:24 PM

## 2015-12-23 NOTE — Progress Notes (Addendum)
[facility] - [place] at [facility]   PATIENT NAME: Stephanie LatinoSonia Reyes Curtis    MR#:  [id number]  DATE OF BIRTH:  [birth year]  SUBJECTIVE:  CHIEF COMPLAINT:  Patient was seen and examined denies any pain, chest pain or shortness of breath today. Resting comfortably  REVIEW OF SYSTEMS:  CONSTITUTIONAL: No fever, fatigue or weakness.  EYES: No blurred or double vision.  EARS, NOSE, AND THROAT: No tinnitus or ear pain.  RESPIRATORY: No cough, shortness of breath, wheezing or hemoptysis.  CARDIOVASCULAR: No chest pain, orthopnea, edema.  GASTROINTESTINAL: No nausea, vomiting, diarrhea or abdominal pain.  GENITOURINARY: No dysuria, hematuria.  ENDOCRINE: No polyuria, nocturia,  HEMATOLOGY: No anemia, easy bruising or bleeding SKIN: Left leg lesions MUSCULOSKELETAL: Reporting left leg pain improvement still has swelling  No joint pain or arthritis.   NEUROLOGIC: No tingling, numbness, weakness.  PSYCHIATRY: No anxiety or depression.   DRUG ALLERGIES:   Allergies  Allergen Reactions  . Penicillins Other (See Comments)    Dizziness    VITALS:  Blood pressure (!) 143/81, pulse 72, temperature 97.6 F (36.4 C), temperature source Oral, resp. rate 16, height 5\' 5"  (1.651 m), weight 84.8 kg (186 lb 14.4 oz), SpO2 98 %.  PHYSICAL EXAMINATION:  GENERAL:  45 y.o.-year-old patient lying in the bed with no acute distress.  EYES: Pupils equal, round, reactive to light and accommodation. No scleral icterus. Extraocular muscles intact.  HEENT: Head atraumatic, normocephalic. Oropharynx and nasopharynx clear.  NECK:  Supple, no jugular venous distention. No thyroid enlargement, no tenderness.  LUNGS: Normal breath sounds bilaterally, no wheezing, rales,rhonchi or crepitation. No use of accessory muscles of respiration.  CARDIOVASCULAR: S1, S2 normal. No murmurs, rubs, or gallops.  ABDOMEN: Soft, nontender, nondistended. Bowel sounds present. No organomegaly or mass. Palpable  left inguinal lymph node EXTREMITIES: Left lower extremity with arterial changes , discoloration , tender in the calf area , erythematous , 1-2+ dorsalis pedis and posterior tibialis pulse. Warm to touch S/p amputation and clean dressing  Has multiple chronic raised, nontender nonerythematous, firm to hard lesions. No  discharge noticed. Soft tissue ulceration of the left plantar aspect noticed  No pedal edema, cyanosis, or clubbing.  NEUROLOGIC: Cranial nerves II through XII are intact. Muscle strength 5/5 in all extremities. Sensation intact. Gait not checked.  PSYCHIATRIC: The patient is alert and oriented x 3.  SKIN: No obvious rash, lesion, or ulcer.    LABORATORY PANEL:   CBC  Recent Labs Lab 12/23/15 0339  WBC 10.6  HGB 10.0*  HCT 30.7*  PLT 144*   ------------------------------------------------------------------------------------------------------------------  Chemistries   Recent Labs Lab 12/16/15 1900  12/23/15 0339  NA 131*  < > 138  K 4.7  < > 4.2  CL 92*  < > 97*  CO2 26  < > 26  GLUCOSE 248*  < > 265*  BUN 38*  < > 14  CREATININE 6.05*  < > 3.87*  CALCIUM 9.8  < > 9.4  AST 16  --   --   ALT 10*  --   --   ALKPHOS 139*  --   --   BILITOT 0.8  --   --   < > = values in this interval not displayed. ------------------------------------------------------------------------------------------------------------------  Cardiac Enzymes No results for input(s): TROPONINI in the last 168 hours. ------------------------------------------------------------------------------------------------------------------  RADIOLOGY:  No results found.  EKG:   Orders placed or performed during the hospital encounter of 07/12/15  . ED EKG  . ED  EKG  . EKG 12-Lead  . EKG 12-Lead    ASSESSMENT AND PLAN:   45 year old Hispanic female came into the ED with a chief complaint of left lower extremity pain and swelling   # Lower limb ischemia -  -Patient had Crosser  atherectomy of the left SFA and popliteal arteries,Percutaneous transluminal angioplasty and stent placement left superficial femoral artery and popliteal by vascular surgery  better circulation in the left leg D/c  heparin drip and start patient on eliquis, pharmacy to dose Hold Plavix for now(patient had surgery by podiatry yesterday) and resume Plavix 75 mg after 2 days of surgery. Discussed with Dr. dew Appreciate vascular surgery recommendations Podiatry -  Had surgery  toe amputation10/26 -appreciate Podiatry recommendations.  Recommending to start physical therapy, left lower extremity with no weightbearing use a wedge shoe for transfers only, can bear some weight on the left heel only if needed   # Left leg DVT (HCC) - discontinue heparin drip and start Coumadin. Pharmacy consulted regarding Coumadin management. Patient does not have insurance and could not afford eliquis, not a candidate for Xarelto as she is a dialysis patient. Discussed with case management Lisa      #left lower leg Cellulitis with mid plantar ulceration - pt received IV Zosyn and vancomycin Follow-up with ID recommending IV vancomycin during hemodialysis for the next 2 weeks and Augmentin 875 mg twice a day for 2 weeks   #Left lower leg lesion-unclear etiology Blood cultures  no growth in 4 days Outpatient follow-up with dermatology for skin biopsy is recommended  infectious disease consulted -appreciate their recommendations Rapid HIV test and hepatitis B surface antigen are negative   #  Diabetes (HCC) - sliding scale insulin with corresponding glucose checks every 6 hours   #Hyperkalemia resolved On dialysis  #  HTN (hypertension) - continue home meds    # ESRD on dialysis [facility]) -  dialysis per nephrology appreciate nephrology recommendations     #HLD (hyperlipidemia) - continue home meds once patient starts taking by mouth  Plan of care discussed with Dr. Wynelle Link, Dr. Graciela Husbands and Dr. Sampson Goon  All the  records are reviewed and case discussed with Care Management/Social Workerr. Management plans discussed with the patient With the help of Spanish-speaking interpreter and they are in agreement.  CODE STATUS: fc  TOTAL TIME TAKING CARE OF THIS PATIENT: 33 minutes.   POSSIBLE D/C IN 2 DAYS, DEPENDING ON CLINICAL CONDITION.  Note: This dictation was prepared with Dragon dictation along with smaller phrase technology. Any transcriptional errors that result from this process are unintentional.   Ramonita Lab M.D on 12/23/2015 at 1:41 PM  Between 7am to 6pm - Pager - [phone] After 6pm go to [email or web address] - password [email or web address] [facility]  [facility]  Office  [phone]  CC: Primary care physician; No PCP Per Patient

## 2015-12-23 NOTE — Care Management (Signed)
Spoke with patient daughter, Angelique BlonderDenise [phone]). She states patient is not a [place] citizen. They pay for prescritptions out of pocket. Has been placed on coumadin, nephrology to follow INR level.  [facility]  Tuesday/Thursday/Saturday It is anticipated that patient will discharge tomorrow with no discharge needs noted.

## 2015-12-23 NOTE — Progress Notes (Addendum)
ANTICOAGULATION CONSULT NOTE - FOLLOW UP  Consult  Pharmacy Consult for heparin drip/warfarin bridge Indication: VTE treatment  Assessment:  Pharmacy consulted for heparin drip management for 45 yo female being treated for left leg DVT. Patient is on heparin. Patient received dialysis on 10/26. Dialysis scheduled TTS.  10/26 Heparin was discontinued for surgery and reinitiated at previous rate of 1750 u/hr and HL was checked 8 hours later.  10/27 0339 HL therapeutic x 1. Continue current rate. Will recheck in 8 hours.  Goal of Therapy:  Heparin level 0.3-0.7 units/ml Monitor platelets by anticoagulation protocol: Yes   Plan:   10/27 1143 HL = 0.42 therapeutic x 2. Per RN, no interruption in therapy and visable signs of bleeding. Continue current rate of 1750 u/hr.  Discussed with nephrology and plan is to transition patient to apixaban over warfarin. With two therapeutic HL, will monitor daily HL and CBC.  Addendum: Heparin has been d/c and apixiban 10 mg PO bid x 7 days followed by apixiban 5 mg PO bid ordered.  Addendum: Due to financial constraints, patient will be bridged from heparin to warfarin therapy. Per RN, heparin was never d/c so will continue current rate. Patient will receive warfarin 7.5 mg PO tonight. Baseline PT/INR and am PT/INR and CBC have been ordered. Heparin will be discontinued if patient is on warfarin for at least 5 days and INR is within therapeutic range on 2 consecutive days. Goal INR 2-3  Date  Dose  INR 10/27  7.5  --    Allergies  Allergen Reactions  . Penicillins Other (See Comments)    Dizziness    Patient Measurements: Height: 5\' 5"  (165.1 cm) Weight: 186 lb 14.4 oz (84.8 kg) IBW/kg (Calculated) : 57 Heparin Dosing Weight: 74 kg  Vital Signs: Temp: 97.6 F (36.4 C) (10/27 0427) Temp Source: Oral (10/27 0427) BP: 143/81 (10/27 1123) Pulse Rate: 72 (10/27 1123)  Labs:  Recent Labs  12/21/15 0950  12/21/15 2214 12/22/15 0436  12/23/15 0339 12/23/15 1143  HGB 11.2*  --   --  11.0* 10.0*  --   HCT 33.1*  --   --  32.9* 30.7*  --   PLT 172  --   --  149* 144*  --   HEPARINUNFRC  --   < > 0.43  --  0.39 0.42  CREATININE 8.10*  --   --  5.43* 3.87*  --   < > = values in this interval not displayed.  Estimated Creatinine Clearance: 19.3 mL/min (by C-G formula based on SCr of 3.87 mg/dL (H)).   Medical History: Past Medical History:  Diagnosis Date  . Diabetes 1.5, managed as type 2 (HCC)   . ESRD on dialysis (HCC)   . Hyperlipidemia   . Hypertension   . Neuropathy (HCC)   . Renal insufficiency     Pharmacy will continue to monitor and adjust per consult.   Horris LatinoHolly Gilliam, PharmD Pharmacy Resident 12/23/2015 5:24 PM

## 2015-12-23 NOTE — Progress Notes (Signed)
ANTICOAGULATION CONSULT NOTE - FOLLOW UP  Consult  Pharmacy Consult for heparin drip Indication: VTE treatment  Assessment:  Pharmacy consulted for heparin drip management for 45 yo female being treated for left leg DVT. Patient is on heparin. Patient received dialysis on 10/26.   Goal of Therapy:  Heparin level 0.3-0.7 units/ml Monitor platelets by anticoagulation protocol: Yes   Plan:  Will discontinue anti-Xa level ordered for 2200. Will follow-up postsurgical and reschedule anti-Xa level when heparin is resumed.   10/26 Heparin drip resumed after surgery ~2010 at previous rate of 1750 units/hr. Will schedule next Heparin level ~ 8 hrs later.   10/27 0339 HL therapeutic x 1. Continue current rate. Will recheck in 8 hours.    Allergies  Allergen Reactions  . Penicillins Other (See Comments)    Dizziness    Patient Measurements: Height: 5\' 5"  (165.1 cm) Weight: 186 lb 14.4 oz (84.8 kg) IBW/kg (Calculated) : 57 Heparin Dosing Weight: 74 kg  Vital Signs: Temp: 97.6 F (36.4 C) (10/27 0427) Temp Source: Oral (10/27 0427) BP: 157/58 (10/27 0427) Pulse Rate: 65 (10/27 0427)  Labs:  Recent Labs  12/21/15 0950 12/21/15 1412 12/21/15 2214 12/22/15 0436 12/23/15 0339  HGB 11.2*  --   --  11.0* 10.0*  HCT 33.1*  --   --  32.9* 30.7*  PLT 172  --   --  149* 144*  HEPARINUNFRC  --  0.33 0.43  --  0.39  CREATININE 8.10*  --   --  5.43* 3.87*    Estimated Creatinine Clearance: 19.3 mL/min (by C-G formula based on SCr of 3.87 mg/dL (H)).   Medical History: Past Medical History:  Diagnosis Date  . Diabetes 1.5, managed as type 2 (HCC)   . ESRD on dialysis (HCC)   . Hyperlipidemia   . Hypertension   . Neuropathy (HCC)   . Renal insufficiency     Pharmacy will continue to monitor and adjust per consult.   Kylia Grajales A. [place], [place]arm.D., BCPS Clinical Pharmacist 12/23/2015

## 2015-12-24 LAB — CBC
HEMATOCRIT: 31.5 % — AB (ref 35.0–47.0)
HEMOGLOBIN: 10 g/dL — AB (ref 12.0–16.0)
MCH: 28.7 pg (ref 26.0–34.0)
MCHC: 31.7 g/dL — ABNORMAL LOW (ref 32.0–36.0)
MCV: 90.5 fL (ref 80.0–100.0)
Platelets: 154 10*3/uL (ref 150–440)
RBC: 3.48 MIL/uL — AB (ref 3.80–5.20)
RDW: 15.1 % — ABNORMAL HIGH (ref 11.5–14.5)
WBC: 13.2 10*3/uL — ABNORMAL HIGH (ref 3.6–11.0)

## 2015-12-24 LAB — PROTIME-INR
INR: 1.71
Prothrombin Time: 20.3 seconds — ABNORMAL HIGH (ref 11.4–15.2)

## 2015-12-24 LAB — HEPARIN LEVEL (UNFRACTIONATED)
Heparin Unfractionated: 0.26 IU/mL — ABNORMAL LOW (ref 0.30–0.70)
Heparin Unfractionated: 0.38 IU/mL (ref 0.30–0.70)

## 2015-12-24 LAB — GLUCOSE, CAPILLARY
GLUCOSE-CAPILLARY: 108 mg/dL — AB (ref 65–99)
GLUCOSE-CAPILLARY: 171 mg/dL — AB (ref 65–99)
GLUCOSE-CAPILLARY: 99 mg/dL (ref 65–99)
Glucose-Capillary: 150 mg/dL — ABNORMAL HIGH (ref 65–99)

## 2015-12-24 MED ORDER — AMOXICILLIN-POT CLAVULANATE 500-125 MG PO TABS
1.0000 | ORAL_TABLET | ORAL | Status: DC
  Administered 2015-12-24: 500 mg via ORAL
  Filled 2015-12-24 (×2): qty 1

## 2015-12-24 MED ORDER — METOPROLOL SUCCINATE ER 25 MG PO TB24: 25.0000 mg | ORAL_TABLET | Freq: Every day | ORAL | 1 refills | Status: AC

## 2015-12-24 MED ORDER — VANCOMYCIN HCL IN DEXTROSE 1-5 GM/200ML-% IV SOLN
1000.0000 mg | INTRAVENOUS | Status: DC
  Administered 2015-12-24: 1000 mg via INTRAVENOUS
  Filled 2015-12-24: qty 200

## 2015-12-24 MED ORDER — METOPROLOL SUCCINATE ER 25 MG PO TB24
25.0000 mg | ORAL_TABLET | Freq: Every day | ORAL | Status: DC
  Administered 2015-12-24 – 2015-12-25 (×2): 25 mg via ORAL
  Filled 2015-12-24 (×2): qty 1

## 2015-12-24 MED ORDER — OXYCODONE HCL 5 MG PO TABS: 5.0000 mg | ORAL_TABLET | Freq: Four times a day (QID) | ORAL | 0 refills | Status: AC | PRN

## 2015-12-24 MED ORDER — AMOXICILLIN-POT CLAVULANATE 500-125 MG PO TABS: 1.0000 | ORAL_TABLET | ORAL | 0 refills | Status: AC

## 2015-12-24 MED ORDER — WARFARIN SODIUM 2.5 MG PO TABS: 5.0000 mg | ORAL_TABLET | Freq: Once | ORAL | 2 refills | Status: DC

## 2015-12-24 MED ORDER — HEPARIN BOLUS VIA INFUSION
1100.0000 [IU] | Freq: Once | INTRAVENOUS | Status: AC
  Administered 2015-12-24: 1100 [IU] via INTRAVENOUS
  Filled 2015-12-24: qty 1100

## 2015-12-24 MED ORDER — CLOPIDOGREL BISULFATE 75 MG PO TABS
75.0000 mg | ORAL_TABLET | Freq: Every day | ORAL | Status: DC
  Administered 2015-12-24 – 2015-12-25 (×2): 75 mg via ORAL
  Filled 2015-12-24 (×2): qty 1

## 2015-12-24 MED ORDER — WARFARIN SODIUM 5 MG PO TABS
5.0000 mg | ORAL_TABLET | Freq: Once | ORAL | Status: AC
  Administered 2015-12-24: 5 mg via ORAL
  Filled 2015-12-24: qty 1

## 2015-12-24 NOTE — Progress Notes (Signed)
Telemetry Clerk notified Primary Nurse that pt HR dropped down to 36. Pt also needed orders for Telemetry. Primary nurse paged and spoke to Hospitalist Dr. Juliene PinaMody. Orders received for Telemetry; Primary RN to monitor HR. t

## 2015-12-24 NOTE — Progress Notes (Addendum)
[facility] - [place] at [facility]   PATIENT NAME: [name]    MR#:  [id number]  DATE OF BIRTH:  [birth year]  SUBJECTIVE:  Doing well. Vial interpreter  REVIEW OF SYSTEMS:   Review of Systems  Constitutional: Negative for chills, fever and weight loss.  HENT: Negative for ear discharge, ear pain and nosebleeds.   Eyes: Negative for blurred vision, pain and discharge.  Respiratory: Negative for sputum production, shortness of breath, wheezing and stridor.   Cardiovascular: Negative for chest pain, palpitations, orthopnea and PND.  Gastrointestinal: Negative for abdominal pain, diarrhea, nausea and vomiting.  Genitourinary: Negative for frequency and urgency.  Musculoskeletal: Negative for back pain and joint pain.  Neurological: Negative for sensory change, speech change, focal weakness and weakness.  Psychiatric/Behavioral: Negative for depression and hallucinations. The patient is not nervous/anxious.    Tolerating Diet:yes Tolerating PT: yes  DRUG ALLERGIES:   Allergies  Allergen Reactions  . Penicillins Other (See Comments)    Has patient had a PCN reaction causing immediate rash, facial/tongue/throat swelling, SOB or lightheadedness with hypotension: no Has patient had a PCN reaction causing severe rash involving mucus membranes or skin necrosis: no Has patient had a PCN reaction that required hospitalization no Has patient had a PCN reaction occurring within the last 10 years: no If all of the above answers are "NO", then may proceed with Cephalosporin use.     VITALS:  Blood pressure 129/79, pulse 77, temperature 97.7 F (36.5 C), temperature source Oral, resp. rate 16, height 5\' 5"  (1.651 m), weight 84.8 kg (186 lb 14.4 oz), SpO2 99 %.  PHYSICAL EXAMINATION:   Physical Exam  GENERAL:  45 y.o.-year-old patient lying in the bed with no acute distress.  EYES: Pupils equal, round, reactive to light and accommodation. No scleral  icterus. Extraocular muscles intact.  HEENT: Head atraumatic, normocephalic. Oropharynx and nasopharynx clear.  NECK:  Supple, no jugular venous distention. No thyroid enlargement, no tenderness.  LUNGS: Normal breath sounds bilaterally, no wheezing, rales, rhonchi. No use of accessory muscles of respiration.  CARDIOVASCULAR: S1, S2 normal. No murmurs, rubs, or gallops.  ABDOMEN: Soft, nontender, nondistended. Bowel sounds present. No organomegaly or mass.  EXTREMITIES: No cyanosis, clubbing or edema b/l.   Left foot dressing + NEUROLOGIC: Cranial nerves II through XII are intact. No focal Motor or sensory deficits b/l.   PSYCHIATRIC:  patient is alert and oriented x 3.  SKIN: No obvious rash, lesion, or ulcer.   LABORATORY PANEL:  CBC  Recent Labs Lab 12/24/15 0554  WBC 13.2*  HGB 10.0*  HCT 31.5*  PLT 154    Chemistries   Recent Labs Lab 12/23/15 0339  NA 138  K 4.2  CL 97*  CO2 26  GLUCOSE 265*  BUN 14  CREATININE 3.87*  CALCIUM 9.4   Cardiac Enzymes No results for input(s): TROPONINI in the last 168 hours. RADIOLOGY:  No results found. ASSESSMENT AND PLAN:  45 year old Hispanic female came into the ED with a chief complaint of left lower extremity pain and swelling   # Lower limb ischemia - with left transmetatarsal forefoot amputation -Patient had  atherectomy of the leftSFA and popliteal arteries,Percutaneous transluminal angioplasty and stent placement left superficial femoral artery and popliteal by vascular surgery on oct 24th -resume plavix for PVD -Appreciate vascular surgery recommendations Podiatry -  Had surgery  toe amputation10/26 -appreciate Podiatry recommendations.  Recommending to start physical therapy, left lower extremity with no weightbearing use a wedge shoe  for transfers only, can bear some weight on the left heel only if needed. -pt will need RW and WC at d/c -po augmentin  #Left leg DVT (HCC)  - continue heparin drip and start  Coumadin. Pharmacy consulted regarding Coumadin management. Patient does not have insurance and could not afford eliquis, not a candidate for Xarelto as she is a dialysis patient. Discussed with case management  -INR 1.7 -check in am  #left lower leg Cellulitis with mid plantar ulceration - pt received IV Zosyn and vancomycin Follow-up with ID recommending IV vancomycin during hemodialysis for the next 2 weeks (started on 21st October) and Augmentin 875 mg twice a day for 2 weeks   #Left lower leg lesion-unclear etiology Blood cultures  no growth in 4 days Outpatient follow-up with dermatology for skin biopsy is recommended  infectious disease consulted -appreciate their recommendations Rapid HIV test and hepatitis B surface antigen are negative   #Diabetes (HCC) - sliding scale insulin with corresponding glucose checks every 6 hours   #Hyperkalemia resolved On dialysis  #HTN (hypertension) - continue home meds. On toprol XL   #ESRD on dialysis [facility](HCC) -  dialysis per nephrology appreciate nephrology recommendations   #HLD (hyperlipidemia) - continue home meds once patient starts taking by mouth  Will d/c once INR therapeutic  Case discussed with Care Management/Social Worker. Management plans discussed with the patient, family and they are in agreement.  CODE STATUS: full  DVT Prophylaxis: on warfarin TOTAL TIME TAKING CARE OF THIS PATIENT: *30minutes.  >50% time spent on counselling and coordination of care pt and dter via interpreter  POSSIBLE D/C IN 1DAYS, DEPENDING ON CLINICAL CONDITION.  Note: This dictation was prepared with Dragon dictation along with smaller phrase technology. Any transcriptional errors that result from this process are unintentional.  Thorin Starner M.D on 12/24/2015 at 2:06 PM  Between 7am to 6pm - Pager - [phone]  After 6pm go to [email or web address] - password [email or web address] [facility]  [facility] [place] [facility]  Office   [phone]  CC: Primary care physician; No PCP Per Patient

## 2015-12-24 NOTE — Progress Notes (Signed)
ANTICOAGULATION CONSULT NOTE - FOLLOW UP  Consult  Pharmacy Consult for Heparin drip/Warfarin bridge Indication: VTE treatment  Assessment:  Pharmacy consulted for heparin drip management for 45 yo female being treated for left leg DVT. Patient is on heparin. Patient received dialysis on 10/26. Dialysis scheduled TTS.  Warfarin started 10/27. Heparin will be discontinued if patient is on warfarin for at least 5 days and INR is within therapeutic range on 2 consecutive days. Goal INR 2-3   Date  Dose  INR 10/27  7.5  1.58 10/28    1.71    Goal of Therapy:  Heparin level 0.3-0.7 units/ml Monitor platelets by anticoagulation protocol: Yes Goal INR 2.0-3.0   Plan:   10/26 Heparin was discontinued for surgery and reinitiated at previous rate of 1750 u/hr and HL was checked 8 hours later.  10/27 0339 HL therapeutic x 1. Continue current rate. Will recheck in 8 hours.  10/27 1143 HL = 0.42 therapeutic x 2. Per RN, no interruption in therapy and visable signs of bleeding. Continue current rate of 1750 u/hr.  Discussed with nephrology and plan is to transition patient to apixaban over warfarin. With two therapeutic HL, will monitor daily HL and CBC. Addendum: Heparin has been d/c and apixiban 10 mg PO bid x 7 days followed by apixiban 5 mg PO bid ordered. Addendum: Due to financial constraints, patient will be bridged from heparin to warfarin therapy. Per RN, heparin was never d/c so will continue current rate. Patient will receive warfarin 7.5 mg PO tonight. Baseline PT/INR and am PT/INR and CBC have been ordered. Heparin will be discontinued if patient is on warfarin for at least 5 days and INR is within therapeutic range on 2 consecutive days. Goal INR 2-3  10/28  Heparin Drip:   HL @ 0554= 0.26. Will give bolus of 1100 units x 1 and increase drip to 1900 units/hr. Hemodialysis pt. Recheck HL in 8 hrs.  Warfarin: Will order Warfarin 5 mg dose x 1 today. F/u  INR in am. Patient on  Augmentin and Vancomycin.       Allergies  Allergen Reactions  . Penicillins Other (See Comments)    Dizziness    Patient Measurements: Height: 5\' 5"  (165.1 cm) Weight: 186 lb 14.4 oz (84.8 kg) IBW/kg (Calculated) : 57 Heparin Dosing Weight: 75.3 kg  Vital Signs: Temp: 97.7 F (36.5 C) (10/28 0449) Temp Source: Oral (10/28 0449) BP: 148/72 (10/28 0449) Pulse Rate: 77 (10/28 0449)  Labs:  Recent Labs  12/21/15 0950  12/22/15 0436 12/23/15 0339 12/23/15 1143 12/23/15 1757 12/24/15 0537 12/24/15 0554  HGB 11.2*  --  11.0* 10.0*  --   --   --  10.0*  HCT 33.1*  --  32.9* 30.7*  --   --   --  31.5*  PLT 172  --  149* 144*  --   --   --  154  LABPROT  --   --   --   --   --  19.0* 20.3*  --   INR  --   --   --   --   --  1.58 1.71  --   HEPARINUNFRC  --   < >  --  0.39 0.42  --   --  0.26*  CREATININE 8.10*  --  5.43* 3.87*  --   --   --   --   < > = values in this interval not displayed.  Estimated Creatinine Clearance: 19.3 mL/min (by  C-G formula based on SCr of 3.87 mg/dL (H)).   Medical History: Past Medical History:  Diagnosis Date  . Diabetes 1.5, managed as type 2 (HCC)   . ESRD on dialysis (HCC)   . Hyperlipidemia   . Hypertension   . Neuropathy (HCC)   . Renal insufficiency     Pharmacy will continue to monitor and adjust per consult.   Bari MantisKristin Jonet Mathies PharmD Clinical Pharmacist 12/24/2015

## 2015-12-24 NOTE — Progress Notes (Signed)
Pre Dialysis 

## 2015-12-24 NOTE — Progress Notes (Signed)
Dialysis complete

## 2015-12-24 NOTE — Evaluation (Signed)
Physical Therapy Evaluation Patient Details Name: Stephanie LatinoSonia Reyes Parga MRN: [id number] DOB: [birth year] Today's Date: 12/24/2015   History of Present Illness  45 year old Hispanic female came into the ED with a chief complaint of left lower extremity pain and swelling.  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  Clinical Impression  Pt presents to PT with below baseline functional mobility s/p toe amputation.  Pt is NWB'ing except through heel with wedge shoe for transfers only.  Pt demonstrated good functional mobility with transfers requiring CGA for initial balance.  Pt able to manage w/c with cues.  Pt's daughter will be at home to assist with care after discharge and pt and family voiced understanding of mobility and weight bearing restrictions.  All education complete and questions answered.     Follow Up Recommendations No PT follow up    Equipment Recommendations  Rolling walker with 5" wheels;Wheelchair (measurements PT)    Recommendations for Other Services       Precautions / Restrictions Precautions Precautions: Fall Required Braces or Orthoses:  (wedge shoe) Restrictions Weight Bearing Restrictions: Yes LLE Weight Bearing: Non weight bearing (L heel weight bearing OK) Other Position/Activity Restrictions: transfers only      Mobility  Bed Mobility Overal bed mobility: Modified Independent (HOB elevated)                Transfers Overall transfer level: Needs assistance Equipment used: Rolling walker (2 wheeled) Transfers: Sit to/from [employer] CorporationStand;Stand Pivot Transfers Sit to Stand: Min guard Stand pivot transfers: Min guard       General transfer comment: bed>wheelchair and wheelchair>recliner with verbal cues for sequencing, good safety awarness  and pacing  Ambulation/Gait   Ambulation Distance (Feet): 0 Feet         General Gait Details: transfers only  Administratortairs            Wheelchair Mobility Wheelchair Mobility Wheelchair mobility: Yes Wheelchair propulsion: Both upper extremities Wheelchair parts: Supervision/cueing Distance: 80 Wheelchair Assistance Details (indicate cue type and reason): Pt able to propel wheelchair, avoiding obstacles and properly engage brackes prior to transfers.  Modified Rankin (Stroke Patients Only)       Balance Overall balance assessment: Modified Independent                                           Pertinent Vitals/Pain Pain Assessment: No/denies pain    Home Living Family/patient expects to be discharged to:: Private residence Living Arrangements: Children Available Help at Discharge: Family Type of Home: Mobile home Home Access: Ramped entrance     Home Layout: One level Home Equipment: Cane - single point      Prior Function Level of Independence: Independent         Comments: independent with dressing and bathing     Hand Dominance        Extremity/Trunk Assessment   Upper Extremity Assessment: Overall WFL for tasks assessed (L UE dialysis access)           Lower Extremity Assessment: Overall WFL for tasks assessed         Communication   Communication: Prefers language other than [place];Interpreter utilized (Spanish)  Cognition Arousal/Alertness: Awake/alert Behavior During Therapy: WFL for tasks assessed/performed Overall Cognitive  Status: Within Functional Limits for tasks assessed                      General Comments General comments (skin integrity, edema, etc.): surgical dressing intact    Exercises     Assessment/Plan    PT Assessment Patent does not need any further PT services  PT Problem List            PT Treatment Interventions      PT Goals (Current goals can be found in the Care Plan  section)  Acute Rehab PT Goals Patient Stated Goal: To go home PT Goal Formulation: With patient Time For Goal Achievement: 12/31/15 Potential to Achieve Goals: Good    Frequency     Barriers to discharge        Co-evaluation               End of Session   Activity Tolerance: Patient tolerated treatment well Patient left: in chair;with family/visitor present Nurse Communication: Mobility status;Weight bearing status         Time: 1009-1040 PT Time Calculation (min) (ACUTE ONLY): 31 min   Charges:   PT Evaluation $PT Eval Moderate Complexity: 1 Procedure PT Treatments $Wheel Chair Management: 8-22 mins   PT G Codes:        Eufelia Veno A Conya Ellinwood, PT 12/24/2015, 10:56 AM

## 2015-12-24 NOTE — Progress Notes (Signed)
Pharmacy Antibiotic Note  [name] is a 45 y.o. female admitted on 12/16/2015 with lower leg cellulitis and ulceration.  Pharmacy has been consulted for vancomycin and piperacillin/tazobactam dosing. Patient had toe amputated 10/26. On HD TTS.  Plan: Piperacillin/tazobactam discontinued  Vancomycin dose increased 10/27 to 1000 mg IV to be given during dialysis on (Tuesday/Thursday/Saturday). Will follow dialysis schedule for trough. Per MD note, ID recommended IV vancomycin during hemodialysis for the next 2 weeks and Augmentin 875 mg twice a day for 2 weeks.  10/28- Augmentin dose adjusted for Hemodialysis dosing to 500mg  Q24h   Height: 5\' 5"  (165.1 cm) Weight: 186 lb 14.4 oz (84.8 kg) IBW/kg (Calculated) : 57  Temp (24hrs), Avg:97.8 F (36.6 C), Min:97.7 F (36.5 C), Max:97.9 F (36.6 C)   Recent Labs Lab 12/20/15 0334 12/21/15 0455 12/21/15 0950 12/22/15 0436 12/22/15 0930 12/23/15 0339 12/24/15 0554  WBC 12.5* 12.5* 13.0* 11.1*  --  10.6 13.2*  CREATININE 6.76* 7.73* 8.10* 5.43*  --  3.87*  --   VANCOTROUGH  --   --   --   --  12*  --   --     Estimated Creatinine Clearance: 19.3 mL/min (by C-G formula based on SCr of 3.87 mg/dL (H)).    Allergies  Allergen Reactions  . Penicillins Other (See Comments)    Dizziness    Antimicrobials this admission: Vancomycin 10/21 >> Piperacillin/tazobactam 10/21 >> 10/27 Augmentin 10/27 >>  Dose adjustments this admission: 10/25 vancomycin increased to 1000mg    Microbiology results: 10/21 BCx: no growth X  5 days 10/21 MRSA PCR: negative  Pharmacy will continue to monitor and adjust per consult.    [name] PharmD Clinical Pharmacist 12/24/2015

## 2015-12-24 NOTE — Progress Notes (Signed)
Subjective:  History taken with the aid of a Spanish interpreter. Overall patient feeling well at the moment. She is due for hemodialysis today.    Objective:  Vital signs in last 24 hours:  Temp:  [97.7 F (36.5 C)-97.9 F (36.6 C)] 97.7 F (36.5 C) (10/28 0449) Pulse Rate:  [72-78] 77 (10/28 0822) Resp:  [16-19] 16 (10/28 0449) BP: (127-164)/(54-81) 129/79 (10/28 0822) SpO2:  [93 %-99 %] 99 % (10/28 0822)  Weight change:  Filed Weights   12/22/15 0925 12/22/15 1227 12/23/15 0429  Weight: 84 kg (185 lb 3 oz) 82.6 kg (182 lb 1.6 oz) 84.8 kg (186 lb 14.4 oz)    Intake/Output:    Intake/Output Summary (Last 24 hours) at 12/24/15 1101 Last data filed at 12/24/15 0900  Gross per 24 hour  Intake           544.79 ml  Output                0 ml  Net           544.79 ml     Physical Exam: General: No acute distress laying in the bed  HEENT Anicteric, moist oral mucous membranes  Neck supple  Pulm/lungs Normal breathing effort, clear to auscultation  CVS/Heart regular, no rub or gallop  Abdomen:  Soft, nontender, nondistended  Extremities: No pedal edema  Neurologic: Alert and oriented  Skin: Left foot in dressings, clean and dry  Access: Left upper extremity AV graft.  Good bruit and thrill       Basic Metabolic Panel:   Recent Labs Lab 12/17/15 1258  12/20/15 0334 12/21/15 0455 12/21/15 0950 12/22/15 0436 12/23/15 0339  NA 129*  < > 130* 128* 128* 135 138  K 4.4  < > 5.3* 6.8* 5.6* 4.2 4.2  CL 91*  < > 90* 89* 88* 93* 97*  CO2 23  < > 26 22 21* 26 26  GLUCOSE 155*  < > 130* 189* 168* 178* 265*  BUN 42*  < > 37* 44* 46* 24* 14  CREATININE 6.83*  < > 6.76* 7.73* 8.10* 5.43* 3.87*  CALCIUM 9.6  < > 9.8 9.6 9.7 9.3 9.4  PHOS 5.7*  --   --   --  8.4*  --   --   < > = values in this interval not displayed.   CBC:  Recent Labs Lab 12/20/15 0334 12/21/15 0455 12/21/15 0950 12/22/15 0436 12/23/15 0339 12/24/15 0554  WBC 12.5* 12.5* 13.0* 11.1* 10.6  13.2*  NEUTROABS 9.4* 10.2*  --   --   --   --   HGB 11.6* 11.4* 11.2* 11.0* 10.0* 10.0*  HCT 35.5 35.3 33.1* 32.9* 30.7* 31.5*  MCV 88.0 88.0 86.3 86.7 89.6 90.5  PLT 179 164 172 149* 144* 154      Microbiology:  Recent Results (from the past 720 hour(s))  MRSA PCR Screening     Status: None   Collection Time: 12/17/15  3:14 AM  Result Value Ref Range Status   MRSA by PCR NEGATIVE NEGATIVE Final    Comment:        The GeneXpert MRSA Assay (FDA approved for NASAL specimens only), is one component of a comprehensive MRSA colonization surveillance program. It is not intended to diagnose MRSA infection nor to guide or monitor treatment for MRSA infections.   Culture, blood (Routine X 2) w Reflex to ID Panel     Status: None   Collection Time: 12/17/15 12:58 PM  Result Value Ref Range Status   Specimen Description BLOOD RIGHT ASSIST CONTROL  Final   Special Requests   Final    BOTTLES DRAWN AEROBIC AND ANAEROBIC  6CCAERO, 7CCANA   Culture NO GROWTH 5 DAYS  Final   Report Status 12/22/2015 FINAL  Final  Culture, blood (Routine X 2) w Reflex to ID Panel     Status: None   Collection Time: 12/17/15  1:04 PM  Result Value Ref Range Status   Specimen Description BLOOD RIGHT HAND  Final   Special Requests   Final    BOTTLES DRAWN AEROBIC AND ANAEROBIC  3CCAERO, 4CCANA   Culture NO GROWTH 5 DAYS  Final   Report Status 12/22/2015 FINAL  Final    Coagulation Studies:  Recent Labs  12/23/15 1757 12/24/15 0537  LABPROT 19.0* 20.3*  INR 1.58 1.71    Urinalysis: No results for input(s): COLORURINE, LABSPEC, PHURINE, GLUCOSEU, HGBUR, BILIRUBINUR, KETONESUR, PROTEINUR, UROBILINOGEN, NITRITE, LEUKOCYTESUR in the last 72 hours.  Invalid input(s): APPERANCEUR    Imaging: No results found.   Medications:   . heparin 1,900 Units/hr (12/24/15 0850)   . amoxicillin-clavulanate  1 tablet Oral Q24H  . insulin aspart  0-9 Units Subcutaneous TID AC & HS  . insulin detemir  8  Units Subcutaneous Daily  . metoprolol succinate  25 mg Oral Daily  . sodium chloride flush  3 mL Intravenous Q12H  . warfarin  5 mg Oral ONCE-1800  . Warfarin - Pharmacist Dosing Inpatient   Does not apply q1800   acetaminophen **OR** acetaminophen, heparin, lidocaine-prilocaine, ondansetron **OR** ondansetron (ZOFRAN) IV, oxyCODONE  Assessment/ Plan:  45 y.o. Hispanic female with end-stage renal disease on hemodialysis, diabetes mellitus type II with nephropathy, peripheral arterial disease, Charcot joint, gangrene of left toes, presents for Leg swelling [M79.89] Lower limb ischemia [I99.8] Cellulitis of toe of left foot [L03.032] Deep vein thrombosis (DVT) of popliteal vein of left lower extremity, unspecified chronicity (HCC) [I82.432]  [facility]   1. End Stage Renal Disease: with hyperkalemia. Seen and examined on hemodialysis. Monitor daily for dialysis need - Patient due for hemodialysis today. Orders have been prepared.   2. Anemia of chronic kidney disease:   - Hemoglobin currently 10.0. Epogen currently on hold.  3.  Secondary hyperparathyroidism: Patient on Renvela powder at home which she will resume.  4.  Left leg DVT  - Patient to be continued on warfarin as an outpatient.  5. Peripheral vascular disease: status post angiogram on 10/24 Dr. Gilda CreaseSchnier. Status post amputation Dr. Alberteen Spindleline on 10/26 - appreciate ID, vascular input and podiatry input.  - Patient transitioned to by mouth Augmentin.  6. Diabetes Mellitus type II with chronic kidney disease: insulin dependent - continue glucose control.    [name], [name] 10/28/201711:01 AM

## 2015-12-24 NOTE — Progress Notes (Signed)
Post dialysis 

## 2015-12-24 NOTE — Care Management Note (Signed)
Case Management Note  Patient Details  Name: [name] MRN: [id number] Date of Birth: 05-04-1970  Subjective/Objective:      Discussed charily financial eligibility with [name]  through an [facility] interpreter. Provided [name] with a charity wheelchair and a rolling walker which her son is currently taking to his car to take home today. Current discharge plan is that [name] will be discharged home today after dialysis.              Action/Plan:   Expected Discharge Date:  12/21/15               Expected Discharge Plan:     In-House Referral:     Discharge planning Services     Post Acute Care Choice:    Choice offered to:     DME Arranged:    DME Agency:     HH Arranged:    HH Agency:     Status of Service:     If discussed at [employer] Meetings, dates discussed:    Additional Comments:  Paschal Blanton A, RN 12/24/2015, 11:11 AM

## 2015-12-24 NOTE — Progress Notes (Signed)
Dialysis started 

## 2015-12-25 LAB — CBC
HEMATOCRIT: 30.1 % — AB (ref 35.0–47.0)
HEMOGLOBIN: 9.7 g/dL — AB (ref 12.0–16.0)
MCH: 29.2 pg (ref 26.0–34.0)
MCHC: 32.3 g/dL (ref 32.0–36.0)
MCV: 90.6 fL (ref 80.0–100.0)
PLATELETS: 143 10*3/uL — AB (ref 150–440)
RBC: 3.32 MIL/uL — AB (ref 3.80–5.20)
RDW: 14.9 % — ABNORMAL HIGH (ref 11.5–14.5)
WBC: 15.2 10*3/uL — AB (ref 3.6–11.0)

## 2015-12-25 LAB — PROTIME-INR
INR: 2.64
Prothrombin Time: 28.7 seconds — ABNORMAL HIGH (ref 11.4–15.2)

## 2015-12-25 LAB — GLUCOSE, CAPILLARY: GLUCOSE-CAPILLARY: 83 mg/dL (ref 65–99)

## 2015-12-25 MED ORDER — INSULIN ASPART 100 UNIT/ML ~~LOC~~ SOLN: 5.0000 [IU] | Freq: Three times a day (TID) | SUBCUTANEOUS | Status: DC

## 2015-12-25 MED ORDER — CLOPIDOGREL BISULFATE 75 MG PO TABS: 75.0000 mg | ORAL_TABLET | Freq: Every day | ORAL | 0 refills | Status: AC

## 2015-12-25 MED ORDER — WARFARIN SODIUM 1 MG PO TABS: 1.5000 mg | ORAL_TABLET | Freq: Every day | ORAL | Status: DC

## 2015-12-25 MED ORDER — WARFARIN SODIUM 1 MG PO TABS: 1.5000 mg | ORAL_TABLET | Freq: Every day | ORAL | 0 refills | Status: AC

## 2015-12-25 MED ORDER — INSULIN ASPART 100 UNIT/ML ~~LOC~~ SOLN: 5.0000 [IU] | Freq: Three times a day (TID) | SUBCUTANEOUS | 11 refills | Status: DC

## 2015-12-25 NOTE — Discharge Instructions (Signed)
[facility] TTS [facility]  Pt's PT/INR needs to be monitored for her DVT left leg . Check INR on Tuesday with HD and inform [facility] [phone] to adjust dosing of coumadin  PT to get Vancomycin with hemodialysis for 2 weeks (first dose was on OCtober 21st)

## 2015-12-25 NOTE — Discharge Summary (Signed)
[facility]   PATIENT NAME: [name]    MR#:  [id number]  DATE OF BIRTH:  March 14, 1970  DATE OF ADMISSION:  12/16/2015 ADMITTING PHYSICIAN: Oralia Manis, MD  DATE OF DISCHARGE: 12/25/15  PRIMARY CARE PHYSICIAN: No PCP Per Patient    ADMISSION DIAGNOSIS:  Leg swelling [M79.89] Lower limb ischemia [I99.8] Cellulitis of toe of left foot [L03.032] Deep vein thrombosis (DVT) of popliteal vein of left lower extremity, unspecified chronicity (HCC) [I82.432]  DISCHARGE DIAGNOSIS:  Left foot ischemia s/p stent and angioplasty oct 24th now on plavix Left LE DVT Left forefoot amputation DM-2 HTN ESRD on HD  SECONDARY DIAGNOSIS:   Past Medical History:  Diagnosis Date  . Diabetes 1.5, managed as type 2 (HCC)   . ESRD on dialysis (HCC)   . Hyperlipidemia   . Hypertension   . Neuropathy (HCC)   . Renal insufficiency     HOSPITAL COURSE:   45 year old Hispanic female came into the ED with a chief complaint of left lower extremity pain and swelling   # Lower limb ischemia - with left transmetatarsal forefoot amputation -Patient had  atherectomy of the leftSFA and popliteal arteries,Percutaneous transluminal angioplasty and stent placement left superficial femoral artery and popliteal by vascular surgery on oct 24th -resume plavix for PVD -Appreciate vascular surgery recommendations Podiatry - s/p transmetatarsal amputation on 10/26-appreciate Podiatry recommendations.  Did well with physical therapy, left lower extremity with no weightbearing use a wedge shoe for transfers only,can bear some weight on the left heel only if needed. -pt will need RW and WC at d/c -po augmentin  #Left leg DVT (HCC)  - continue heparin drip and start Coumadin. Pharmacy consulted regarding Coumadin management. Patient does not have insurance and could not afford eliquis,not a candidate for Xarelto as she is a dialysis patient.  Discussed with case management  -INR 1.7---2.6 -will send pt home on coumadin 1.5 mg and INR will be followed by [facility]  #left lower leg Cellulitis with mid plantar ulceration - pt received IV Zosyn and vancomycin Follow-up with ID recommending IV vancomycin during hemodialysis for the next 2 weeks (started on 21st October) and Augmentin 875 mg twice a day for 2 weeks  #Left lower leg lesion-unclear etiology Blood cultures no growth in 4 days Outpatient follow-up with dermatology for skin biopsy is recommended infectious disease consulted -appreciate their recommendations Rapid HIV test and hepatitis B surface antigen are negative   #Diabetes (HCC) - sliding scale insulin with corresponding glucose checks every 6 hours  resume novolog R 5 units tid(home dose)  #Hyperkalemia resolved On dialysis  #HTN (hypertension) - continue home meds. On toprol XL  #ESRD on dialysis (HCC) - dialysis per nephrology appreciate nephrology recommendations  #HLD (hyperlipidemia) - continue home meds once patient starts taking by mouth  D/c home D/w pt and her dter via interpreter  CONSULTS OBTAINED:  Treatment Team:  Tonette Lederer, PA-C Linus Galas, DPM Mosetta Pigeon, MD Ramonita Lab, MD Mick Sell, MD Enedina Finner, MD  DRUG ALLERGIES:   Allergies  Allergen Reactions  . Penicillins Other (See Comments)    Has patient had a PCN reaction causing immediate rash, facial/tongue/throat swelling, SOB or lightheadedness with hypotension: no Has patient had a PCN reaction causing severe rash involving mucus membranes or skin necrosis: no Has patient had a PCN reaction that required hospitalization no Has patient had a PCN reaction occurring within the last 10 years: no If all  of the above answers are "NO", then may proceed with Cephalosporin use.     DISCHARGE MEDICATIONS:   Current Discharge Medication List    START taking these  medications   Details  amoxicillin-clavulanate (AUGMENTIN) 500-125 MG tablet Take 1 tablet (500 mg total) by mouth daily. Qty: 10 tablet, Refills: 0    clopidogrel (PLAVIX) 75 MG tablet Take 1 tablet (75 mg total) by mouth daily. Qty: 75 tablet, Refills: 0    metoprolol succinate (TOPROL-XL) 25 MG 24 hr tablet Take 1 tablet (25 mg total) by mouth daily. Qty: 30 tablet, Refills: 1    oxyCODONE (OXY IR/ROXICODONE) 5 MG immediate release tablet Take 1 tablet (5 mg total) by mouth every 6 (six) hours as needed for moderate pain. Qty: 30 tablet, Refills: 0    warfarin (COUMADIN) 1 MG tablet Take 1.5 tablets (1.5 mg total) by mouth daily at 6 PM. Qty: 30 tablet, Refills: 0      CONTINUE these medications which have NOT CHANGED   Details  insulin aspart (NOVOLOG) 100 UNIT/ML injection Inject 5 Units into the skin 3 (three) times daily with meals.    lanthanum (FOSRENOL) 1000 MG chewable tablet Chew 1,000 mg by mouth 3 (three) times daily with meals.    cinacalcet (SENSIPAR) 30 MG tablet Take 30 mg by mouth daily.        If you experience worsening of your admission symptoms, develop shortness of breath, life threatening emergency, suicidal or homicidal thoughts you must seek medical attention immediately by calling 911 or calling your MD immediately  if symptoms less severe.  You Must read complete instructions/literature along with all the possible adverse reactions/side effects for all the Medicines you take and that have been prescribed to you. Take any new Medicines after you have completely understood and accept all the possible adverse reactions/side effects.   Please note  You were cared for by a hospitalist during your hospital stay. If you have any questions about your discharge medications or the care you received while you were in the hospital after you are discharged, you can call the unit and asked to speak with the hospitalist on call if the hospitalist that took care of you  is not available. Once you are discharged, your primary care physician will handle any further medical issues. Please note that NO REFILLS for any discharge medications will be authorized once you are discharged, as it is imperative that you return to your primary care physician (or establish a relationship with a primary care physician if you do not have one) for your aftercare needs so that they can reassess your need for medications and monitor your lab values. Today   SUBJECTIVE    No new complaints VITAL SIGNS:  Blood pressure (!) 107/40, pulse 73, temperature 98.6 F (37 C), temperature source Oral, resp. rate 18, height 5\' 5"  (1.651 m), weight 127.1 kg (280 lb 4.8 oz), SpO2 97 %.  I/O:    Intake/Output Summary (Last 24 hours) at 12/25/15 0802 Last data filed at 12/24/15 2247  Gross per 24 hour  Intake           805.88 ml  Output             1000 ml  Net          -194.12 ml    PHYSICAL EXAMINATION:  GENERAL:  45 y.o.-year-old patient lying in the bed with no acute distress.  EYES: Pupils equal, round, reactive to light and accommodation. No  scleral icterus. Extraocular muscles intact.  HEENT: Head atraumatic, normocephalic. Oropharynx and nasopharynx clear.  NECK:  Supple, no jugular venous distention. No thyroid enlargement, no tenderness.  LUNGS: Normal breath sounds bilaterally, no wheezing, rales,rhonchi or crepitation. No use of accessory muscles of respiration.  CARDIOVASCULAR: S1, S2 normal. No murmurs, rubs, or gallops.  ABDOMEN: Soft, non-tender, non-distended. Bowel sounds present. No organomegaly or mass.  EXTREMITIES: No pedal edema, cyanosis, or clubbing. Left foot dressing+ NEUROLOGIC: Cranial nerves II through XII are intact. Muscle strength 5/5 in all extremities. Sensation intact. Gait not checked.  PSYCHIATRIC: The patient is alert and oriented x 3.  SKIN: No obvious rash, lesion, or ulcer.   DATA REVIEW:   CBC   Recent Labs Lab 12/25/15 0303  WBC  15.2*  HGB 9.7*  HCT 30.1*  PLT 143*    Chemistries   Recent Labs Lab 12/23/15 0339  NA 138  K 4.2  CL 97*  CO2 26  GLUCOSE 265*  BUN 14  CREATININE 3.87*  CALCIUM 9.4    Microbiology Results   Recent Results (from the past 240 hour(s))  MRSA PCR Screening     Status: None   Collection Time: 12/17/15  3:14 AM  Result Value Ref Range Status   MRSA by PCR NEGATIVE NEGATIVE Final    Comment:        The GeneXpert MRSA Assay (FDA approved for NASAL specimens only), is one component of a comprehensive MRSA colonization surveillance program. It is not intended to diagnose MRSA infection nor to guide or monitor treatment for MRSA infections.   Culture, blood (Routine X 2) w Reflex to ID Panel     Status: None   Collection Time: 12/17/15 12:58 PM  Result Value Ref Range Status   Specimen Description BLOOD RIGHT ASSIST CONTROL  Final   Special Requests   Final    BOTTLES DRAWN AEROBIC AND ANAEROBIC  6CCAERO, 7CCANA   Culture NO GROWTH 5 DAYS  Final   Report Status 12/22/2015 FINAL  Final  Culture, blood (Routine X 2) w Reflex to ID Panel     Status: None   Collection Time: 12/17/15  1:04 PM  Result Value Ref Range Status   Specimen Description BLOOD RIGHT HAND  Final   Special Requests   Final    BOTTLES DRAWN AEROBIC AND ANAEROBIC  3CCAERO, 4CCANA   Culture NO GROWTH 5 DAYS  Final   Report Status 12/22/2015 FINAL  Final    RADIOLOGY:  No results found.   Management plans discussed with the patient, family and they are in agreement.  CODE STATUS:     Code Status Orders        Start     Ordered   12/17/15 0205  Full code  Continuous     12/17/15 0204    Code Status History    Date Active Date Inactive Code Status Order ID Comments User Context   07/12/2015  4:16 AM 07/13/2015  2:33 PM Full Code [id number]  Ihor AustinPavan Pyreddy, MD Inpatient      TOTAL TIME TAKING CARE OF THIS PATIENT: 40 minutes.    Lexandra Rettke M.D on 12/25/2015 at 8:02 AM  Between 7am  to 6pm - Pager - [phone] After 6pm go to [email or web address] - password [email or web address] [facility]  [facility]  Office  [phone]  CC: Primary care physician; No PCP Per Patient

## 2015-12-25 NOTE — Progress Notes (Signed)
Stephanie ChurnSonia Reyes Curtis to be D/C'd Home per MD order.  Discussed with the patient and all questions fully answered.  VSS, Skin clean, dry and intact without evidence of skin break down, no evidence of skin tears noted. IV catheter discontinued intact. Site without signs and symptoms of complications. Dressing and pressure applied.  An After Visit Summary was printed and given to the patient. Patient received prescription.  D/c education completed with patient/family including follow up instructions, medication list, d/c activities limitations if indicated, with other d/c instructions as indicated by MD - patient able to verbalize understanding, all questions fully answered.   Patient instructed to return to ED, call 911, or call MD for any changes in condition.   Patient escorted via WC, and D/C home via private auto.  Akshaya Toepfer 12/25/2015 10:00 AM

## 2015-12-26 LAB — SURGICAL PATHOLOGY

## 2016-01-06 DIAGNOSIS — I10 Essential (primary) hypertension: Secondary | ICD-10-CM

## 2016-01-06 DIAGNOSIS — N186 End stage renal disease: Secondary | ICD-10-CM

## 2016-01-06 DIAGNOSIS — I70262 Atherosclerosis of native arteries of extremities with gangrene, left leg: Secondary | ICD-10-CM

## 2016-01-06 DIAGNOSIS — K922 Gastrointestinal hemorrhage, unspecified: Secondary | ICD-10-CM

## 2016-01-18 LAB — HEPATITIS B SURFACE ANTIGEN: HEP B S AG: NEGATIVE

## 2016-01-18 LAB — HEPATITIS B SURFACE ANTIBODY,QUALITATIVE: Hep B S Ab: REACTIVE

## 2016-01-21 ENCOUNTER — Encounter: Payer: Self-pay | Admitting: Emergency Medicine
